# Patient Record
Sex: Male | Born: 1950
Health system: Southern US, Community
[De-identification: ages and names within clinical notes are randomized; demographics above are authoritative.]

## PROBLEM LIST (undated history)

## (undated) DIAGNOSIS — M109 Gout, unspecified: Secondary | ICD-10-CM

## (undated) DIAGNOSIS — E079 Disorder of thyroid, unspecified: Secondary | ICD-10-CM

## (undated) DIAGNOSIS — I10 Essential (primary) hypertension: Secondary | ICD-10-CM

## (undated) HISTORY — PX: NO PAST SURGERIES: SHX2092

---

## 2011-05-08 ENCOUNTER — Ambulatory Visit: Payer: Self-pay | Admitting: Gastroenterology

## 2012-03-18 ENCOUNTER — Ambulatory Visit: Payer: Self-pay | Admitting: Gastroenterology

## 2015-03-06 DIAGNOSIS — K219 Gastro-esophageal reflux disease without esophagitis: Secondary | ICD-10-CM | POA: Insufficient documentation

## 2015-03-06 DIAGNOSIS — M109 Gout, unspecified: Secondary | ICD-10-CM | POA: Insufficient documentation

## 2015-03-06 DIAGNOSIS — B182 Chronic viral hepatitis C: Secondary | ICD-10-CM | POA: Insufficient documentation

## 2015-03-06 DIAGNOSIS — I10 Essential (primary) hypertension: Secondary | ICD-10-CM | POA: Insufficient documentation

## 2015-10-02 DIAGNOSIS — E782 Mixed hyperlipidemia: Secondary | ICD-10-CM | POA: Insufficient documentation

## 2015-10-02 DIAGNOSIS — E785 Hyperlipidemia, unspecified: Secondary | ICD-10-CM | POA: Insufficient documentation

## 2015-10-02 DIAGNOSIS — E079 Disorder of thyroid, unspecified: Secondary | ICD-10-CM | POA: Insufficient documentation

## 2016-12-03 ENCOUNTER — Emergency Department (HOSPITAL_BASED_OUTPATIENT_CLINIC_OR_DEPARTMENT_OTHER)
Admission: EM | Admit: 2016-12-03 | Discharge: 2016-12-03 | Disposition: A | Discharge status: Home / Self Care | Payer: Medicare Other | Attending: Emergency Medicine | Admitting: Emergency Medicine

## 2016-12-03 ENCOUNTER — Emergency Department (HOSPITAL_BASED_OUTPATIENT_CLINIC_OR_DEPARTMENT_OTHER): Payer: Medicare Other

## 2016-12-03 ENCOUNTER — Encounter (HOSPITAL_BASED_OUTPATIENT_CLINIC_OR_DEPARTMENT_OTHER): Payer: Self-pay | Admitting: *Deleted

## 2016-12-03 DIAGNOSIS — Y929 Unspecified place or not applicable: Secondary | ICD-10-CM | POA: Insufficient documentation

## 2016-12-03 DIAGNOSIS — Y999 Unspecified external cause status: Secondary | ICD-10-CM | POA: Diagnosis not present

## 2016-12-03 DIAGNOSIS — S42035A Nondisplaced fracture of lateral end of left clavicle, initial encounter for closed fracture: Secondary | ICD-10-CM | POA: Diagnosis not present

## 2016-12-03 DIAGNOSIS — Y9389 Activity, other specified: Secondary | ICD-10-CM | POA: Diagnosis not present

## 2016-12-03 DIAGNOSIS — Z87891 Personal history of nicotine dependence: Secondary | ICD-10-CM | POA: Insufficient documentation

## 2016-12-03 DIAGNOSIS — I10 Essential (primary) hypertension: Secondary | ICD-10-CM | POA: Diagnosis not present

## 2016-12-03 DIAGNOSIS — Z7982 Long term (current) use of aspirin: Secondary | ICD-10-CM | POA: Insufficient documentation

## 2016-12-03 DIAGNOSIS — S4992XA Unspecified injury of left shoulder and upper arm, initial encounter: Secondary | ICD-10-CM | POA: Diagnosis present

## 2016-12-03 DIAGNOSIS — W1839XA Other fall on same level, initial encounter: Secondary | ICD-10-CM | POA: Insufficient documentation

## 2016-12-03 HISTORY — DX: Disorder of thyroid, unspecified: E07.9

## 2016-12-03 HISTORY — DX: Essential (primary) hypertension: I10

## 2016-12-03 HISTORY — DX: Gout, unspecified: M10.9

## 2016-12-03 MED ORDER — HYDROCODONE-ACETAMINOPHEN 5-325 MG PO TABS: 2.0000 | ORAL_TABLET | ORAL | 0 refills | Status: DC | PRN

## 2016-12-03 MED ORDER — HYDROCODONE-ACETAMINOPHEN 5-325 MG PO TABS
2.0000 | ORAL_TABLET | Freq: Once | ORAL | Status: AC
  Administered 2016-12-03: 2 via ORAL
  Filled 2016-12-03: qty 2

## 2016-12-03 MED FILL — HYDROCODON-APAP 5-325: 5-325 | 2 days supply | Qty: 20 | Fill #0

## 2016-12-03 NOTE — ED Provider Notes (Signed)
[facility] Provider Note   CSN: [id number] Arrival date & time: 12/03/16  1021     History   Chief Complaint Chief Complaint  Patient presents with  . Shoulder Pain    HPI Paul Burke is a 66 y.o. male.  The history is provided by the patient. No language interpreter was used.  Shoulder Pain   This is a new problem. The current episode started 2 days ago. The problem occurs constantly. The problem has been gradually worsening. The quality of the pain is described as aching. The pain is moderate. Associated symptoms include limited range of motion. He has tried nothing for the symptoms. The treatment provided no relief. There has been a history of trauma.  Pt felll on 1/1 Pt complains of pain in his shoulder after falling  Past Medical History:  Diagnosis Date  . Gout   . Hypertension   . Thyroid disease     There are no active problems to display for this patient.   History reviewed. No pertinent surgical history.     Home Medications    Prior to Admission medications   Medication Sig Start Date End Date Taking? Authorizing Provider  aspirin 81 MG chewable tablet Chew by mouth daily.   Yes Historical Provider, MD  HYDROcodone-acetaminophen (NORCO/VICODIN) 5-325 MG tablet Take 2 tablets by mouth every 4 (four) hours as needed. 12/03/16   Elson AreasLeslie K Sofia, PA-C    Family History History reviewed. No pertinent family history.  Social History Social History  Substance Use Topics  . Smoking status: Former Games developermoker  . Smokeless tobacco: Never Used  . Alcohol use Not on file     Allergies   Patient has no known allergies.   Review of Systems Review of Systems  All other systems reviewed and are negative.    Physical Exam Updated Vital Signs BP 118/86 (BP Location: Right Arm)   Pulse 118   Temp 98.4 F (36.9 C) (Oral)   Resp 18   Ht 6' (1.829 m)   Wt 105.2 kg   SpO2 99%   BMI 31.46 kg/m   Physical Exam  Constitutional: He is oriented  to person, place, and time. He appears well-developed and well-nourished.  HENT:  Head: Normocephalic.  Eyes: EOM are normal.  Neck: Normal range of motion.  Pulmonary/Chest: Effort normal.  Abdominal: He exhibits no distension.  Musculoskeletal: He exhibits tenderness.  Swollen distal clavicle, pain with movement.  nv and ns intact  Neurological: He is alert and oriented to person, place, and time.  Psychiatric: He has a normal mood and affect.  Nursing note and vitals reviewed.    ED Treatments / Results  Labs (all labs ordered are listed, but only abnormal results are displayed) Labs Reviewed - No data to display  EKG  EKG Interpretation None       Radiology Dg Shoulder Left  Result Date: 12/03/2016 CLINICAL DATA:  Larey SeatFell on left shoulder 3 days ago. Unable to abduct arm. Initial encounter. EXAM: LEFT SHOULDER - 2+ VIEW COMPARISON:  None. FINDINGS: A nondisplaced fracture is present within the distal left clavicle. The shoulder is located. The scapula is intact. The visualized hemithorax is unremarkable. IMPRESSION: 1. Nondisplaced fracture of the distal left clavicle. 2. The shoulder is otherwise intact. Electronically Signed   By: Marin Robertshristopher  Mattern M.D.   On: 12/03/2016 12:36    Procedures Procedures (including critical care time)  Medications Ordered in ED Medications  HYDROcodone-acetaminophen (NORCO/VICODIN) 5-325 MG per tablet 2 tablet (  2 tablets Oral Given 12/03/16 1246)     Initial Impression / Assessment and Plan / ED Course  I have reviewed the triage vital signs and the nursing notes.  Pertinent labs & imaging results that were available during my care of the patient were reviewed by me and considered in my medical decision making (see chart for details).  Clinical Course       Final Clinical Impressions(s) / ED Diagnoses   Final diagnoses:  Closed nondisplaced fracture of acromial end of left clavicle, initial encounter    New Prescriptions New  Prescriptions   HYDROCODONE-ACETAMINOPHEN (NORCO/VICODIN) 5-325 MG TABLET    Take 2 tablets by mouth every 4 (four) hours as needed.  An After Visit Summary was printed and given to the patient.   Lonia Skinner [place], PA-C 12/03/16 1345    Tilden Fossa, MD 12/04/16 [phone]

## 2016-12-03 NOTE — ED Triage Notes (Signed)
Pt reports horse playing with his grandson on NYE and landed on his left shoulder, decreased rom and pain continues.

## 2016-12-09 ENCOUNTER — Ambulatory Visit (INDEPENDENT_AMBULATORY_CARE_PROVIDER_SITE_OTHER): Payer: Medicare Other | Admitting: Family Medicine

## 2016-12-09 DIAGNOSIS — S42032A Displaced fracture of lateral end of left clavicle, initial encounter for closed fracture: Secondary | ICD-10-CM | POA: Diagnosis not present

## 2016-12-09 MED ORDER — HYDROCODONE-ACETAMINOPHEN 5-325 MG PO TABS: 1.0000 | ORAL_TABLET | ORAL | 0 refills | Status: DC | PRN

## 2016-12-09 MED ORDER — HYDROCODONE-ACETAMINOPHEN 5-325 MG PO TABS: 2.0000 | ORAL_TABLET | ORAL | 0 refills | Status: DC | PRN

## 2016-12-09 MED FILL — HYDROCODON-APAP 5-325: 5-325 | 5 days supply | Qty: 30 | Fill #0

## 2016-12-09 NOTE — Patient Instructions (Signed)
You have a distal clavicle fracture. I expect this to heal completely over 6-8 total weeks from when you fractured this. Icing 15 minutes at a time 3-4 times a day. Ibuprofen or aleve if needed for pain and inflammation. Norco as needed for severe pain (no driving on this medicine). Use sling as often as possible (ok to take off to wash this arm, drive but I wouldn't use the left hand on the steering wheel). Follow up with me in 1 week for reevaluation. Make sure you come out of the sling at least twice a day to do elbow motion exercises.

## 2016-12-10 ENCOUNTER — Encounter: Payer: Self-pay | Admitting: Family Medicine

## 2016-12-10 DIAGNOSIS — S42032D Displaced fracture of lateral end of left clavicle, subsequent encounter for fracture with routine healing: Secondary | ICD-10-CM | POA: Insufficient documentation

## 2016-12-10 NOTE — Progress Notes (Signed)
PCP: No PCP Per Patient  Subjective:   HPI: Patient is a 66 y.o. male here for left shoulder injury.  Patient reports he was playing with his grandson on 1/1 when he tripped over his foot and fell directly onto left shoulder. Immediate pain, some swelling lateral collarbone area. Pain 7/10, sharp, worse with washing hair. Using a sling regularly also. No other skin changes, numbness. No radiation of pain. No prior injuries. Right handed.  Past Medical History:  Diagnosis Date  . Gout   . Hypertension   . Thyroid disease     Current Outpatient Prescriptions on File Prior to Visit  Medication Sig Dispense Refill  . aspirin 81 MG chewable tablet Chew by mouth daily.     No current facility-administered medications on file prior to visit.     No past surgical history on file.  No Known Allergies  Social History   Social History  . Marital status: Single    Spouse name: N/A  . Number of children: N/A  . Years of education: N/A   Occupational History  . Not on file.   Social History Main Topics  . Smoking status: Former Games developermoker  . Smokeless tobacco: Never Used  . Alcohol use Not on file  . Drug use: Unknown  . Sexual activity: Not on file   Other Topics Concern  . Not on file   Social History Narrative  . No narrative on file    No family history on file.  BP 110/72   Pulse (!) 105   Ht 6' (1.829 m)   Wt 232 lb (105.2 kg)   BMI 31.46 kg/m   Review of Systems: See HPI above.     Objective:  Physical Exam:  Gen: NAD, comfortable in exam room  Left shoulder: Mild swelling over distal clavicle.  No other deformity. TTP distal clavicle.  No other tenderness. FROM digits, wrist, some difficulty with full elbow extension.  Did not test shoulder ROM with known fracture. Strength 5/5 with all these resisted motions of digits, wrist, elbow. Sensation intact to light touch. NV intact distally.  Right shoulder: FROM without pain.   Assessment &  Plan:  1. Left distal clavicle fracture - independently reviewed radiographs - fracture is nondisplaced.  Should heal well with conservative treatment over 6-8 weeks.  Sling for comfort - come out of this twice a day to do motion exercises of elbow.  Icing, ibuprofen/aleve with  norco as needed.  F/u in 1 week - repeat radiographs at that visit.

## 2016-12-10 NOTE — Assessment & Plan Note (Signed)
independently reviewed radiographs - fracture is nondisplaced.  Should heal well with conservative treatment over 6-8 weeks.  Sling for comfort - come out of this twice a day to do motion exercises of elbow.  Icing, ibuprofen/aleve with  norco as needed.  F/u in 1 week - repeat radiographs at that visit.

## 2016-12-17 ENCOUNTER — Ambulatory Visit: Payer: Medicare Other | Admitting: Family Medicine

## 2016-12-19 ENCOUNTER — Ambulatory Visit (HOSPITAL_BASED_OUTPATIENT_CLINIC_OR_DEPARTMENT_OTHER)
Admission: RE | Admit: 2016-12-19 | Discharge: 2016-12-19 | Disposition: A | Discharge status: Home / Self Care | Payer: Medicare Other | Source: Ambulatory Visit | Attending: Family Medicine | Admitting: Family Medicine

## 2016-12-19 ENCOUNTER — Encounter: Payer: Self-pay | Admitting: Family Medicine

## 2016-12-19 ENCOUNTER — Ambulatory Visit (INDEPENDENT_AMBULATORY_CARE_PROVIDER_SITE_OTHER): Payer: Medicare Other | Admitting: Family Medicine

## 2016-12-19 VITALS — BP 99/70 | Ht 72.0 in | Wt 232.0 lb

## 2016-12-19 DIAGNOSIS — S42032D Displaced fracture of lateral end of left clavicle, subsequent encounter for fracture with routine healing: Secondary | ICD-10-CM

## 2016-12-19 DIAGNOSIS — S42035D Nondisplaced fracture of lateral end of left clavicle, subsequent encounter for fracture with routine healing: Secondary | ICD-10-CM

## 2016-12-19 DIAGNOSIS — M19012 Primary osteoarthritis, left shoulder: Secondary | ICD-10-CM | POA: Diagnosis not present

## 2016-12-19 DIAGNOSIS — X58XXXD Exposure to other specified factors, subsequent encounter: Secondary | ICD-10-CM | POA: Diagnosis not present

## 2016-12-19 NOTE — Patient Instructions (Signed)
You have a distal clavicle fracture. I expect this to heal completely over 6-8 total weeks from when you fractured this. Icing 15 minutes at a time 3-4 times a day as needed (if this bothers you after icing you can just use it over the swollen area by your neck). Ibuprofen or aleve if needed for pain and inflammation. Arm circles, swings, table slides - 3 sets of 10 once or twice a day. Elbow motion exercises as well. Follow up with me in 2 weeks for reevaluation.

## 2016-12-22 NOTE — Progress Notes (Signed)
PCP: No PCP Per Patient  Subjective:   HPI: Patient is a 66 y.o. male here for left shoulder injury.  1/9: Patient reports he was playing with his grandson on 1/1 when he tripped over his foot and fell directly onto left shoulder. Immediate pain, some swelling lateral collarbone area. Pain 7/10, sharp, worse with washing hair. Using a sling regularly also. No other skin changes, numbness. No radiation of pain. No prior injuries. Right handed.  1/19: Patient reports he is doing well. Pain level has improved, less sharp. Still difficulty trying to wash hair. Using sling but also doing motion exercises of elbow. No skin changes, numbness.  Past Medical History:  Diagnosis Date  . Gout   . Hypertension   . Thyroid disease     Current Outpatient Prescriptions on File Prior to Visit  Medication Sig Dispense Refill  . amLODipine (NORVASC) 10 MG tablet     . aspirin 81 MG chewable tablet Chew by mouth daily.    . colchicine 0.6 MG tablet     . HYDROcodone-acetaminophen (NORCO/VICODIN) 5-325 MG tablet Take 1 tablet by mouth every 4 (four) hours as needed. 30 tablet 0  . levothyroxine (SYNTHROID, LEVOTHROID) 50 MCG tablet     . lisinopril (PRINIVIL,ZESTRIL) 40 MG tablet     . omeprazole (PRILOSEC) 40 MG capsule      No current facility-administered medications on file prior to visit.     No past surgical history on file.  No Known Allergies  Social History   Social History  . Marital status: Single    Spouse name: N/A  . Number of children: N/A  . Years of education: N/A   Occupational History  . Not on file.   Social History Main Topics  . Smoking status: Former Games developermoker  . Smokeless tobacco: Never Used  . Alcohol use Not on file  . Drug use: Unknown  . Sexual activity: Not on file   Other Topics Concern  . Not on file   Social History Narrative  . No narrative on file    No family history on file.  BP 99/70   Ht 6' (1.829 m)   Wt 232 lb (105.2 kg)    BMI 31.46 kg/m   Review of Systems: See HPI above.     Objective:  Physical Exam:  Gen: NAD, comfortable in exam room  Left shoulder: Mild swelling over distal clavicle.  No other deformity. TTP distal clavicle.  No other tenderness. FROM digits, wrist, still some difficulty with full elbow extension.  Did not test shoulder ROM with known fracture. Strength 5/5 with resisted motions of digits, wrist, elbow. Sensation intact to light touch. NV intact distally.  Right shoulder: FROM without pain.   Assessment & Plan:  1. Left distal clavicle fracture - independently reviewed repeat radiographs - fracture is nondisplaced without significant healing yet.   Should heal well with conservative treatment over total of 6-8 weeks.  Come out of sling to do motion exercises of shoulder now too which were shown today.  Continue elbow exercises.  Ibuprofen or aleve if needed, icing.  F/u in 2 weeks.  If doing well and no new injury may not need to repeat radiographs.

## 2016-12-22 NOTE — Assessment & Plan Note (Signed)
independently reviewed repeat radiographs - fracture is nondisplaced without significant healing yet.   Should heal well with conservative treatment over total of 6-8 weeks.  Come out of sling to do motion exercises of shoulder now too which were shown today.  Continue elbow exercises.  Ibuprofen or aleve if needed, icing.  F/u in 2 weeks.  If doing well and no new injury may not need to repeat radiographs.

## 2017-01-05 ENCOUNTER — Ambulatory Visit: Payer: Medicare Other | Admitting: Family Medicine

## 2017-01-06 ENCOUNTER — Ambulatory Visit: Payer: Medicare Other | Admitting: Family Medicine

## 2017-01-06 ENCOUNTER — Ambulatory Visit (INDEPENDENT_AMBULATORY_CARE_PROVIDER_SITE_OTHER): Payer: Medicare Other | Admitting: Family Medicine

## 2017-01-06 ENCOUNTER — Encounter: Payer: Self-pay | Admitting: Family Medicine

## 2017-01-06 DIAGNOSIS — S42032D Displaced fracture of lateral end of left clavicle, subsequent encounter for fracture with routine healing: Secondary | ICD-10-CM | POA: Diagnosis not present

## 2017-01-06 NOTE — Patient Instructions (Signed)
You have a distal clavicle fracture. Continue the motion exercises. Wait until 2/12 to start the theraband strengthening exercises. Call me if you want to do physical therapy. Icing if needed. Follow up with me on or around 2/26 for reevaluation.

## 2017-01-07 NOTE — Assessment & Plan Note (Signed)
Left distal clavicle fracture - Clinically improving.  Last radiographs with nondisplaced fracture.  Continue motion exercises and add strengthening exercises on 2/12.  F/u around 2/26 when he is 8 weeks out.  Consider radiographs if he doesn't continue to clinically improve.  Discontinue sling.  Ibuprofen or aleve if needed, icing.

## 2017-01-07 NOTE — Progress Notes (Signed)
PCP: No PCP Per Patient  Subjective:   HPI: Patient is a 66 y.o. male here for left shoulder injury.  1/9: Patient reports he was playing with his grandson on 1/1 when he tripped over his foot and fell directly onto left shoulder. Immediate pain, some swelling lateral collarbone area. Pain 7/10, sharp, worse with washing hair. Using a sling regularly also. No other skin changes, numbness. No radiation of pain. No prior injuries. Right handed.  1/19: Patient reports he is doing well. Pain level has improved, less sharp. Still difficulty trying to wash hair. Using sling but also doing motion exercises of elbow. No skin changes, numbness.  2/6: Patient reports he feels about 80% improved. Pain up to 3/10 at worst. Still with soreness mainly feels when reaching across body, overhead. Doing home exercises. No skin changes, numbness.  Past Medical History:  Diagnosis Date  . Gout   . Hypertension   . Thyroid disease     Current Outpatient Prescriptions on File Prior to Visit  Medication Sig Dispense Refill  . amLODipine (NORVASC) 10 MG tablet     . aspirin 81 MG chewable tablet Chew by mouth daily.    . colchicine 0.6 MG tablet     . HYDROcodone-acetaminophen (NORCO/VICODIN) 5-325 MG tablet Take 1 tablet by mouth every 4 (four) hours as needed. 30 tablet 0  . levothyroxine (SYNTHROID, LEVOTHROID) 50 MCG tablet     . lisinopril (PRINIVIL,ZESTRIL) 40 MG tablet     . omeprazole (PRILOSEC) 40 MG capsule      No current facility-administered medications on file prior to visit.     No past surgical history on file.  No Known Allergies  Social History   Social History  . Marital status: Single    Spouse name: N/A  . Number of children: N/A  . Years of education: N/A   Occupational History  . Not on file.   Social History Main Topics  . Smoking status: Former Games developermoker  . Smokeless tobacco: Never Used  . Alcohol use Not on file  . Drug use: Unknown  . Sexual  activity: Not on file   Other Topics Concern  . Not on file   Social History Narrative  . No narrative on file    No family history on file.  BP 138/86   Pulse 98   Ht 6' (1.829 m)   Wt 232 lb (105.2 kg)   BMI 31.46 kg/m   Review of Systems: See HPI above.     Objective:  Physical Exam:  Gen: NAD, comfortable in exam room  Left shoulder: Mild swelling over distal clavicle.  No other deformity. No TTP distal clavicle.  No other tenderness. FROM digits, wrist, elbow.  Lacks 20 degrees flexion, 40 degrees abduction shoulder. Strength 5/5 with resisted motions of digits, wrist, elbow. Sensation intact to light touch. NV intact distally.  Right shoulder: FROM without pain.   Assessment & Plan:  1. Left distal clavicle fracture - Clinically improving.  Last radiographs with nondisplaced fracture.  Continue motion exercises and add strengthening exercises on 2/12.  F/u around 2/26 when he is 8 weeks out.  Consider radiographs if he doesn't continue to clinically improve.  Discontinue sling.  Ibuprofen or aleve if needed, icing.

## 2017-01-26 ENCOUNTER — Ambulatory Visit: Payer: Medicare Other | Admitting: Family Medicine

## 2017-11-13 ENCOUNTER — Ambulatory Visit: Payer: Medicare Other | Admitting: Family Medicine

## 2017-11-20 ENCOUNTER — Ambulatory Visit (INDEPENDENT_AMBULATORY_CARE_PROVIDER_SITE_OTHER): Payer: Medicare Other | Admitting: Family Medicine

## 2017-11-20 ENCOUNTER — Encounter: Payer: Self-pay | Admitting: Family Medicine

## 2017-11-20 VITALS — BP 126/90 | HR 114 | Temp 98.5°F | Ht 72.0 in | Wt 232.1 lb

## 2017-11-20 DIAGNOSIS — Z23 Encounter for immunization: Secondary | ICD-10-CM | POA: Diagnosis not present

## 2017-11-20 DIAGNOSIS — E782 Mixed hyperlipidemia: Secondary | ICD-10-CM

## 2017-11-20 DIAGNOSIS — Z8619 Personal history of other infectious and parasitic diseases: Secondary | ICD-10-CM | POA: Diagnosis not present

## 2017-11-20 DIAGNOSIS — I10 Essential (primary) hypertension: Secondary | ICD-10-CM

## 2017-11-20 DIAGNOSIS — M109 Gout, unspecified: Secondary | ICD-10-CM | POA: Diagnosis not present

## 2017-11-20 DIAGNOSIS — Z125 Encounter for screening for malignant neoplasm of prostate: Secondary | ICD-10-CM

## 2017-11-20 DIAGNOSIS — R000 Tachycardia, unspecified: Secondary | ICD-10-CM | POA: Diagnosis not present

## 2017-11-20 DIAGNOSIS — Z0000 Encounter for general adult medical examination without abnormal findings: Secondary | ICD-10-CM | POA: Insufficient documentation

## 2017-11-20 DIAGNOSIS — E079 Disorder of thyroid, unspecified: Secondary | ICD-10-CM

## 2017-11-20 DIAGNOSIS — K219 Gastro-esophageal reflux disease without esophagitis: Secondary | ICD-10-CM | POA: Diagnosis not present

## 2017-11-20 LAB — URINALYSIS, ROUTINE W REFLEX MICROSCOPIC
Hgb urine dipstick: NEGATIVE
KETONES UR: NEGATIVE
LEUKOCYTES UA: NEGATIVE
Nitrite: NEGATIVE
PH: 6 (ref 5.0–8.0)
RBC / HPF: NONE SEEN (ref 0–?)
SPECIFIC GRAVITY, URINE: 1.02 (ref 1.000–1.030)
TOTAL PROTEIN, URINE-UPE24: NEGATIVE
URINE GLUCOSE: NEGATIVE
UROBILINOGEN UA: 1 (ref 0.0–1.0)

## 2017-11-20 LAB — LIPID PANEL
CHOLESTEROL: 147 mg/dL (ref 0–200)
HDL: 50.9 mg/dL (ref >39.00)
LDL CALC: 80 mg/dL (ref 0–99)
NonHDL: 95.69
TRIGLYCERIDES: 76 mg/dL (ref 0.0–149.0)
Total CHOL/HDL Ratio: 3
VLDL: 15.2 mg/dL (ref 0.0–40.0)

## 2017-11-20 LAB — COMPREHENSIVE METABOLIC PANEL
ALBUMIN: 4.4 g/dL (ref 3.5–5.2)
ALK PHOS: 61 U/L (ref 39–117)
ALT: 28 U/L (ref 0–53)
AST: 34 U/L (ref 0–37)
BILIRUBIN TOTAL: 0.6 mg/dL (ref 0.2–1.2)
BUN: 28 mg/dL — ABNORMAL HIGH (ref 6–23)
CALCIUM: 9.4 mg/dL (ref 8.4–10.5)
CHLORIDE: 107 meq/L (ref 96–112)
CO2: 23 mEq/L (ref 19–32)
CREATININE: 1.42 mg/dL (ref 0.40–1.50)
GFR: 63.81 mL/min (ref >60.00)
Glucose, Bld: 133 mg/dL — ABNORMAL HIGH (ref 70–99)
Potassium: 4.2 mEq/L (ref 3.5–5.1)
Sodium: 140 mEq/L (ref 135–145)
TOTAL PROTEIN: 8.6 g/dL — AB (ref 6.0–8.3)

## 2017-11-20 LAB — TSH: TSH: 1.4 u[IU]/mL (ref 0.35–4.50)

## 2017-11-20 LAB — CBC
HEMATOCRIT: 39.4 % (ref 39.0–52.0)
Hemoglobin: 12.9 g/dL — ABNORMAL LOW (ref 13.0–17.0)
MCHC: 32.7 g/dL (ref 30.0–36.0)
MCV: 96.4 fl (ref 78.0–100.0)
PLATELETS: 215 10*3/uL (ref 150.0–400.0)
RBC: 4.09 Mil/uL — AB (ref 4.22–5.81)
RDW: 13.4 % (ref 11.5–15.5)
WBC: 6.1 10*3/uL (ref 4.0–10.5)

## 2017-11-20 LAB — URIC ACID: URIC ACID, SERUM: 7.3 mg/dL (ref 4.0–7.8)

## 2017-11-20 LAB — PSA: PSA: 0.79 ng/mL (ref 0.10–4.00)

## 2017-11-20 MED ORDER — LISINOPRIL 40 MG PO TABS: 40.0000 mg | ORAL_TABLET | Freq: Every day | ORAL | 0 refills | Status: DC

## 2017-11-20 MED ORDER — OMEPRAZOLE 40 MG PO CPDR: 40.0000 mg | DELAYED_RELEASE_CAPSULE | Freq: Every day | ORAL | 0 refills | Status: DC

## 2017-11-20 MED ORDER — ALLOPURINOL 100 MG PO TABS: 200.0000 mg | ORAL_TABLET | Freq: Every day | ORAL | 0 refills | Status: DC

## 2017-11-20 MED ORDER — AMLODIPINE BESYLATE 10 MG PO TABS: 10.0000 mg | ORAL_TABLET | Freq: Every day | ORAL | 0 refills | Status: DC

## 2017-11-20 NOTE — Progress Notes (Signed)
Subjective:  Patient ID: Paul Burke, male    DOB: 02/27/1951  Age: 66 y.o. MRN: [id number]  CC: Establish Care   HPI Paul Burke presents for establishment of care and follow-up of his listed problems.  He has never been diagnosed with an elevated pulse rate 4.  He does not smoke, use illicit drugs, or drink beverages with caffeine.  Occasional beer on the weekends watching the game but has not had any alcohol in over a week now.  His thyroid medicine was increased several months ago.  History of hep C that was treated and cured.  History of gout.  He has been taking allopurinol 200 mg with colchicine twice daily for over 2 months.  He has not had a gouty attack since that time.  Last colonoscopy was 4-5 years ago.  His brother Paul Burke is also my patient.  History Paul Burke has a past medical history of Gout, Hypertension, and Thyroid disease.   He has no past surgical history on file.   His family history is not on file.He reports that he has quit smoking. he has never used smokeless tobacco. His alcohol and drug histories are not on file.  Outpatient Medications Prior to Visit  Medication Sig Dispense Refill  . aspirin 81 MG chewable tablet Chew 81 mg by mouth daily.     . colchicine 0.6 MG tablet Take 0.6 mg by mouth daily.     [name]. levothyroxine (SYNTHROID, LEVOTHROID) 75 MCG tablet TAKE 1 TABLET BY MOUTH EVERY DAY IN THE MORNING    . allopurinol (ZYLOPRIM) 100 MG tablet TAKE 2 TABLETS BY MOUTH EVERY DAY    . amLODipine (NORVASC) 10 MG tablet     . amLODipine (NORVASC) 10 MG tablet Take 1 tablet by mouth daily.    [name]. lisinopril (PRINIVIL,ZESTRIL) 40 MG tablet Take 40 mg by mouth daily.     [name]. omeprazole (PRILOSEC) 40 MG capsule Take 40 mg by mouth daily.     [name]. HYDROcodone-acetaminophen (NORCO/VICODIN) 5-325 MG tablet Take 1 tablet by mouth every 4 (four) hours as needed. 30 tablet 0  . levothyroxine (SYNTHROID, LEVOTHROID) 50 MCG tablet      No facility-administered medications prior  to visit.     ROS Review of Systems  Constitutional: Negative.  Negative for chills, fever and unexpected weight change.  HENT: Negative.   Eyes: Negative for photophobia and visual disturbance.  Respiratory: Negative.   Cardiovascular: Negative for chest pain, palpitations and leg swelling.  Gastrointestinal: Negative.  Negative for anal bleeding and blood in stool.  Endocrine: Negative for cold intolerance and heat intolerance.  Genitourinary: Negative for difficulty urinating, hematuria and urgency.  Musculoskeletal: Negative for gait problem and myalgias.  Skin: Negative for pallor and rash.  Allergic/Immunologic: Negative for immunocompromised state.  Neurological: Negative for weakness and headaches.  Hematological: Does not bruise/bleed easily.  Psychiatric/Behavioral: Negative for agitation and dysphoric mood.    Objective:  BP 126/90 (BP Location: Left Arm, Patient Position: Sitting, Cuff Size: Normal)   Pulse (!) 114   Temp 98.5 F (36.9 C) (Oral)   Ht 6' (1.829 m)   Wt 232 lb 2 oz (105.3 kg)   SpO2 98%   BMI 31.48 kg/m   Physical Exam  Constitutional: He is oriented to person, place, and time. He appears well-developed and well-nourished. No distress.  HENT:  Head: Normocephalic and atraumatic.  Right Ear: External ear normal.  Left Ear: External ear normal.  Mouth/Throat: Oropharynx is clear and moist.  No oropharyngeal exudate.  Eyes: Conjunctivae are normal. Pupils are equal, round, and reactive to light. Right eye exhibits no discharge. Left eye exhibits no discharge. No scleral icterus.  Neck: Neck supple. No JVD present. No tracheal deviation present. No thyromegaly present.  Cardiovascular: Regular rhythm and normal heart sounds. Tachycardia present.  Pulmonary/Chest: Effort normal and breath sounds normal. No stridor.  Abdominal: Soft. Bowel sounds are normal. He exhibits no distension. There is no tenderness. There is no rebound and no guarding.    Genitourinary: Rectal exam shows no external hemorrhoid, no internal hemorrhoid, no fissure, no mass, no tenderness, anal tone normal and guaiac negative stool. Prostate is not enlarged and not tender.  Lymphadenopathy:    He has no cervical adenopathy.  Neurological: He is alert and oriented to person, place, and time.  Skin: Skin is warm and dry. No rash noted. He is not diaphoretic. No erythema.  Psychiatric: He has a normal mood and affect. His behavior is normal.      Assessment & Plan:   Paul Burke was seen today for establish care.  Diagnoses and all orders for this visit:  Need for influenza vaccination -     Flu vaccine HIGH DOSE PF  Need for vaccination against Streptococcus pneumoniae using pneumococcal conjugate vaccine 13 -     Pneumococcal conjugate vaccine 13-valent  Mixed hyperlipidemia -     Comprehensive metabolic panel -     Lipid panel  Gout without tophus -     allopurinol (ZYLOPRIM) 100 MG tablet; Take 2 tablets (200 mg total) by mouth daily. TAKE 2 TABLETS BY MOUTH EVERY DAY -     Uric acid  Thyroid disease -     TSH  Gastroesophageal reflux disease without esophagitis -     omeprazole (PRILOSEC) 40 MG capsule; Take 1 capsule (40 mg total) by mouth daily.  Essential hypertension -     amLODipine (NORVASC) 10 MG tablet; Take 1 tablet (10 mg total) by mouth daily. -     lisinopril (PRINIVIL,ZESTRIL) 40 MG tablet; Take 1 tablet (40 mg total) by mouth daily. -     CBC -     Comprehensive metabolic panel -     Urinalysis, Routine w reflex microscopic  Elevated pulse rate -     TSH  Screening for prostate cancer -     PSA  History of hepatitis C   I have discontinued Paul Burke HYDROcodone-acetaminophen and amLODipine. I have also changed his allopurinol, amLODipine, lisinopril, and omeprazole. Additionally, I am having him maintain his aspirin, colchicine, and levothyroxine.  Meds ordered this encounter  Medications  . allopurinol  (ZYLOPRIM) 100 MG tablet    Sig: Take 2 tablets (200 mg total) by mouth daily. TAKE 2 TABLETS BY MOUTH EVERY DAY    Dispense:  180 tablet    Refill:  0  . amLODipine (NORVASC) 10 MG tablet    Sig: Take 1 tablet (10 mg total) by mouth daily.    Dispense:  90 tablet    Refill:  0  . lisinopril (PRINIVIL,ZESTRIL) 40 MG tablet    Sig: Take 1 tablet (40 mg total) by mouth daily.    Dispense:  90 tablet    Refill:  0  . omeprazole (PRILOSEC) 40 MG capsule    Sig: Take 1 capsule (40 mg total) by mouth daily.    Dispense:  90 capsule    Refill:  0   I asked patient to go ahead and hold his  colchicine and continue the allopurinol at 200 mg.  Uric acid level pends.  He will follow-up in 2 weeks and we will recheck his pulse rate.  TSH level pends  Follow-up: Return in about 2 weeks (around 12/04/2017).  Mliss Sax, MD

## 2017-11-25 ENCOUNTER — Encounter: Payer: Self-pay | Admitting: Family Medicine

## 2017-11-25 NOTE — Telephone Encounter (Signed)
-----   Message from Luvenia ReddenSarah E Self, CMA sent at 11/25/2017  9:34 AM EST ----- Left voicemail for patient to call the office back.  CRM created. Please schedule 3 month follow up with Dr. Doreene BurkeKremer when patient returns call.

## 2017-11-25 NOTE — Telephone Encounter (Signed)
This encounter was created in error - please disregard.

## 2017-11-25 NOTE — Telephone Encounter (Signed)
Results given  Needed to make appt

## 2017-12-22 ENCOUNTER — Telehealth: Payer: Self-pay | Admitting: Family Medicine

## 2017-12-22 ENCOUNTER — Encounter: Payer: Self-pay | Admitting: Family Medicine

## 2017-12-22 ENCOUNTER — Ambulatory Visit (INDEPENDENT_AMBULATORY_CARE_PROVIDER_SITE_OTHER): Payer: Medicare Other | Admitting: Family Medicine

## 2017-12-22 ENCOUNTER — Ambulatory Visit: Payer: Medicare Other | Admitting: Family Medicine

## 2017-12-22 VITALS — BP 136/90 | HR 98 | Ht 72.0 in | Wt 236.0 lb

## 2017-12-22 DIAGNOSIS — H9202 Otalgia, left ear: Secondary | ICD-10-CM | POA: Diagnosis not present

## 2017-12-22 DIAGNOSIS — D649 Anemia, unspecified: Secondary | ICD-10-CM | POA: Diagnosis not present

## 2017-12-22 DIAGNOSIS — Z7289 Other problems related to lifestyle: Secondary | ICD-10-CM | POA: Insufficient documentation

## 2017-12-22 DIAGNOSIS — R000 Tachycardia, unspecified: Secondary | ICD-10-CM | POA: Diagnosis not present

## 2017-12-22 DIAGNOSIS — Z789 Other specified health status: Secondary | ICD-10-CM | POA: Diagnosis not present

## 2017-12-22 LAB — CBC
HEMATOCRIT: 38.9 % — AB (ref 39.0–52.0)
Hemoglobin: 12.9 g/dL — ABNORMAL LOW (ref 13.0–17.0)
MCHC: 33.3 g/dL (ref 30.0–36.0)
MCV: 95.4 fl (ref 78.0–100.0)
PLATELETS: 205 10*3/uL (ref 150.0–400.0)
RBC: 4.07 Mil/uL — AB (ref 4.22–5.81)
RDW: 14.2 % (ref 11.5–15.5)
WBC: 5.4 10*3/uL (ref 4.0–10.5)

## 2017-12-22 LAB — IRON,TIBC AND FERRITIN PANEL
%SAT: 39 % (calc) (ref 15–60)
Ferritin: 252 ng/mL (ref 20–380)
Iron: 135 ug/dL (ref 50–180)
TIBC: 346 mcg/dL (calc) (ref 250–425)

## 2017-12-22 MED ORDER — FLUTICASONE PROPIONATE 50 MCG/ACT NA SUSP: 2.0000 | Freq: Every day | NASAL | 6 refills | Status: DC

## 2017-12-22 MED ORDER — LEVOTHYROXINE SODIUM 75 MCG PO TABS: 75.0000 ug | ORAL_TABLET | Freq: Every day | ORAL | 1 refills | Status: DC

## 2017-12-22 NOTE — Telephone Encounter (Signed)
Pt requesting refill of Levothyroxine 5275mcg-90 day supply. Medication not previously prescribed by Dr. Doreene BurkeKremer. Last OV to establish care was on 11/20/17

## 2017-12-22 NOTE — Progress Notes (Signed)
Subjective:  Patient ID: Paul Burke, male    DOB: 12/17/1950  Age: 67 y.o. MRN: [id number]  CC: left ear pain   HPI Paul Burke presents for 2-day history of left ear pain.  There is been no decrease in hearing or URI signs and symptoms.  He says he took a piece of paper with some wax out of his ear.  He tells me that he had a colonoscopy he thinks that 2 years ago and cannot remember the results.  He is taking his thyroid medicine as directed daily.  He tells me that he drinks up to 4 16 ounce beers daily when he is watching sports games.  Denies blood in his stool or melena.  He tells me he is certain that he had a colonoscopy and an EGD done a few years ago.  Chart review shows that he was sent a certified letter at this time but we see no evidence of a procedure.  He is going to go to the place where it was done and bring back documentation of the procedure.  In the meantime I have already requested that he be seen for consultation for colonoscopy.  Outpatient Medications Prior to Visit  Medication Sig Dispense Refill  . allopurinol (ZYLOPRIM) 100 MG tablet Take 2 tablets (200 mg total) by mouth daily. TAKE 2 TABLETS BY MOUTH EVERY DAY 180 tablet 0  . amLODipine (NORVASC) 10 MG tablet Take 1 tablet (10 mg total) by mouth daily. 90 tablet 0  . aspirin 81 MG chewable tablet Chew 81 mg by mouth daily.     . colchicine 0.6 MG tablet Take 0.6 mg by mouth daily.     [name]. levothyroxine (SYNTHROID, LEVOTHROID) 75 MCG tablet TAKE 1 TABLET BY MOUTH EVERY DAY IN THE MORNING    . lisinopril (PRINIVIL,ZESTRIL) 40 MG tablet Take 1 tablet (40 mg total) by mouth daily. 90 tablet 0  . omeprazole (PRILOSEC) 40 MG capsule Take 1 capsule (40 mg total) by mouth daily. 90 capsule 0   No facility-administered medications prior to visit.     ROS Review of Systems  Constitutional: Negative for chills, diaphoresis and fatigue.  HENT: Positive for ear discharge and ear pain. Negative for congestion, facial  swelling, postnasal drip, rhinorrhea, sinus pressure, sinus pain and sore throat.   Eyes: Negative for photophobia and visual disturbance.  Respiratory: Negative for wheezing.   Cardiovascular: Negative for chest pain and palpitations.  Gastrointestinal: Negative for anal bleeding, blood in stool, constipation, diarrhea, nausea and vomiting.  Endocrine: Negative for cold intolerance, heat intolerance and polyphagia.  Musculoskeletal: Negative for joint swelling.  Skin: Negative for pallor and rash.  Allergic/Immunologic: Negative for immunocompromised state.  Neurological: Negative for headaches.  Hematological: Negative.   Psychiatric/Behavioral: Negative.     Objective:  BP 136/90 (BP Location: Left Arm, Patient Position: Sitting, Cuff Size: Normal)   Pulse 98   Ht 6' (1.829 m)   Wt 236 lb (107 kg)   SpO2 98%   BMI 32.01 kg/m   BP Readings from Last 3 Encounters:  12/22/17 136/90  11/20/17 126/90  01/06/17 138/86    Wt Readings from Last 3 Encounters:  12/22/17 236 lb (107 kg)  11/20/17 232 lb 2 oz (105.3 kg)  01/06/17 232 lb (105.2 kg)    Physical Exam  Constitutional: He is oriented to person, place, and time. He appears well-developed and well-nourished. No distress.  HENT:  Head: Normocephalic and atraumatic.  Right Ear: External ear  normal.  Left Ear: External ear normal.  Ears:  Mouth/Throat: Oropharynx is clear and moist. No oropharyngeal exudate.  Eyes: Conjunctivae are normal. Pupils are equal, round, and reactive to light. Right eye exhibits no discharge. Left eye exhibits no discharge. No scleral icterus.  Neck: Neck supple. No JVD present. No tracheal deviation present. No thyromegaly present.  Cardiovascular: Normal rate, regular rhythm and normal heart sounds. Exam reveals no gallop and no friction rub.  No murmur heard. Pulmonary/Chest: Effort normal and breath sounds normal. No stridor.  Abdominal: Bowel sounds are normal.  Lymphadenopathy:    He  has no cervical adenopathy.  Neurological: He is alert and oriented to person, place, and time.  Skin: Skin is warm and dry. He is not diaphoretic.  Psychiatric: He has a normal mood and affect. His behavior is normal.    Lab Results  Component Value Date   WBC 6.1 11/20/2017   HGB 12.9 (L) 11/20/2017   HCT 39.4 11/20/2017   PLT 215.0 11/20/2017   GLUCOSE 133 (H) 11/20/2017   CHOL 147 11/20/2017   TRIG 76.0 11/20/2017   HDL 50.90 11/20/2017   LDLCALC 80 11/20/2017   ALT 28 11/20/2017   AST 34 11/20/2017   NA 140 11/20/2017   K 4.2 11/20/2017   CL 107 11/20/2017   CREATININE 1.42 11/20/2017   BUN 28 (H) 11/20/2017   CO2 23 11/20/2017   TSH 1.40 11/20/2017   PSA 0.79 11/20/2017    Dg Clavicle Left  Result Date: 12/19/2016 CLINICAL DATA:  History of fall with a distal left clavicle fracture 11/30/2016. Subsequent encounter. EXAM: LEFT CLAVICLE - 2+ VIEWS [facility] COMPARISON:  Plain films left shoulder 12/03/2016. FINDINGS: Nondisplaced fracture of the distal clavicle is again seen. There is some early healing about the fracture. The acromioclavicular joint is intact with degenerative change noted. Glenohumeral joint is unremarkable. IMPRESSION: Nondisplaced distal left clavicle fracture with early healing. Acromioclavicular osteoarthritis. Electronically Signed   By: Drusilla Kanner M.D.   On: 12/19/2016 12:35    Assessment & Plan:   Shondell was seen today for left ear pain.  Diagnoses and all orders for this visit:  Ear pain, left -     Ambulatory referral to ENT -     fluticasone (FLONASE) 50 MCG/ACT nasal spray; Place 2 sprays into both nostrils daily.  Tachycardia -     EKG 12-Lead  Anemia, unspecified type -     CBC -     Iron, TIBC and Ferritin Panel -     Ambulatory referral to Gastroenterology  Drinks alcohol   I am having Paul Burke start on fluticasone. I am also having him maintain his aspirin, colchicine, levothyroxine, allopurinol, amLODipine,  lisinopril, and omeprazole.  Meds ordered this encounter  Medications  . fluticasone (FLONASE) 50 MCG/ACT nasal spray    Sig: Place 2 sprays into both nostrils daily.    Dispense:  16 g    Refill:  6   EKG did show normal sinus rhythm with a rate of 80.  We are rechecking a CBC with an iron panel today.  He is certain that he had a colonoscopy and EGD done.  [facility] but there is no documentation of it in the chart.  There is documentation that they send him a certified letter requesting that he come in for consultation for the procedure.  He says that this is where he had the procedure done.  Follow-up is in 2 months.  Follow-up: Return in  about 2 months (around 02/19/2018).  Mliss Sax, MD

## 2017-12-22 NOTE — Telephone Encounter (Signed)
Copied from CRM [id number]. Topic: General - Other >> Dec 22, 2017 11:33 AM Raquel SarnaHayes, Teresa G wrote: Levothyroxine 75 mg - 90 day supply  [facility] -  [phone]

## 2017-12-22 NOTE — Telephone Encounter (Signed)
Rx sent 

## 2018-01-01 NOTE — Progress Notes (Deleted)
Subjective:   Paul Burke is a 67 y.o. male who presents for an Initial Medicare Annual Wellness Visit. The Patient was informed that the wellness visit is to identify future health risk and educate and initiate measures that can reduce risk for increased disease through the lifespan.   Describes health as fair, good or great?  Review of Systems No ROS.  Medicare Wellness Visit. Additional risk factors are reflected in the social history.    Sleep patterns: Home Safety/Smoke Alarms: Feels safe in home. Smoke alarms in place.  Living environment; residence and Firearm Safety:  Seat Belt Safety/Bike Helmet: Wears seat belt.   Male:   CCS-     PSA-  Lab Results  Component Value Date   PSA 0.79 11/20/2017      Objective:    There were no vitals filed for this visit. There is no height or weight on file to calculate BMI.  Advanced Directives 12/03/2016  Does Patient Have a Medical Advance Directive? No  Would patient like information on creating a medical advance directive? No - Patient declined    Current Medications (verified) Outpatient Encounter Medications as of 01/06/2018  Medication Sig  . allopurinol (ZYLOPRIM) 100 MG tablet Take 2 tablets (200 mg total) by mouth daily. TAKE 2 TABLETS BY MOUTH EVERY DAY  . amLODipine (NORVASC) 10 MG tablet Take 1 tablet (10 mg total) by mouth daily.  [name]. aspirin 81 MG chewable tablet Chew 81 mg by mouth daily.   . colchicine 0.6 MG tablet Take 0.6 mg by mouth daily.   . fluticasone (FLONASE) 50 MCG/ACT nasal spray Place 2 sprays into both nostrils daily.  [name]. levothyroxine (SYNTHROID, LEVOTHROID) 75 MCG tablet Take 1 tablet (75 mcg total) by mouth daily before breakfast. TAKE 1 TABLET BY MOUTH EVERY DAY IN THE MORNING  . lisinopril (PRINIVIL,ZESTRIL) 40 MG tablet Take 1 tablet (40 mg total) by mouth daily.  [name]. omeprazole (PRILOSEC) 40 MG capsule Take 1 capsule (40 mg total) by mouth daily.   No facility-administered encounter  medications on file as of 01/06/2018.     Allergies (verified) Patient has no known allergies.   History: Past Medical History:  Diagnosis Date  . Gout   . Hypertension   . Thyroid disease    No past surgical history on file. No family history on file. Social History   Socioeconomic History  . Marital status: Single    Spouse name: Not on file  . Number of children: Not on file  . Years of education: Not on file  . Highest education level: Not on file  Social Needs  . Financial resource strain: Not on file  . Food insecurity - worry: Not on file  . Food insecurity - inability: Not on file  . Transportation needs - medical: Not on file  . Transportation needs - non-medical: Not on file  Occupational History  . Not on file  Tobacco Use  . Smoking status: Former Games developermoker  . Smokeless tobacco: Never Used  Substance and Sexual Activity  . Alcohol use: Not on file  . Drug use: Not on file  . Sexual activity: Not on file  Other Topics Concern  . Not on file  Social History Narrative  . Not on file   Tobacco Counseling Counseling given: Not Answered   Clinical Intake:                       Activities of Daily Living No flowsheet  data found.   Immunizations and Health Maintenance Immunization History  Administered Date(s) Administered  . Influenza, High Dose Seasonal PF 11/20/2017  . Influenza,inj,Quad PF,6+ Mos 10/02/2015, 11/05/2016  . Pneumococcal Conjugate-13 11/20/2017  . Pneumococcal Polysaccharide-23 10/02/2015   Health Maintenance Due  Topic Date Due  . TETANUS/TDAP  03/02/1969    Patient Care Team: Mliss Sax, MD as PCP - General (Family Medicine)  Indicate any recent Medical Services you may have received from other than [facility] providers in the past year (date may be approximate).    Assessment:   This is a routine wellness examination for Paul Burke. Physical assessment deferred to PCP.  Hearing/Vision screen No exam data  present  Dietary issues and exercise activities discussed:   Diet (meal preparation, eat out, water intake, caffeinated beverages, dairy products, fruits and vegetables): {Desc; diets:16563} Breakfast: Lunch:  Dinner:      Goals    None     Depression Screen PHQ 2/9 Scores 12/19/2016  PHQ - 2 Score 0  Exception Documentation Other- indicate reason in comment box    Fall Risk Fall Risk  12/19/2016  Falls in the past year? No  Risk for fall due to : Other (Comment)    Cognitive Function:        Screening Tests Health Maintenance  Topic Date Due  . TETANUS/TDAP  03/02/1969  . COLONOSCOPY  08/30/2024  . INFLUENZA VACCINE  Completed  . Hepatitis C Screening  Completed  . PNA vac Low Risk Adult  Completed      Plan:   ***  I have personally reviewed and noted the following in the patient's chart:   . Medical and social history . Use of alcohol, tobacco or illicit drugs  . Current medications and supplements . Functional ability and status . Nutritional status . Physical activity . Advanced directives . List of other physicians . Hospitalizations, surgeries, and ER visits in previous 12 months . Vitals . Screenings to include cognitive, depression, and falls . Referrals and appointments  In addition, I have reviewed and discussed with patient certain preventive protocols, quality metrics, and best practice recommendations. A written personalized care plan for preventive services as well as general preventive health recommendations were provided to patient.     [place]on Gully, [place]   01/01/2018

## 2018-01-05 DIAGNOSIS — H900 Conductive hearing loss, bilateral: Secondary | ICD-10-CM | POA: Insufficient documentation

## 2018-01-06 ENCOUNTER — Ambulatory Visit: Payer: Medicare Other | Admitting: Behavioral Health

## 2018-02-21 ENCOUNTER — Other Ambulatory Visit: Payer: Self-pay | Admitting: Family Medicine

## 2018-02-21 DIAGNOSIS — M109 Gout, unspecified: Secondary | ICD-10-CM

## 2018-02-23 ENCOUNTER — Ambulatory Visit: Payer: Medicare Other | Admitting: Family Medicine

## 2018-02-23 DIAGNOSIS — Z0289 Encounter for other administrative examinations: Secondary | ICD-10-CM

## 2018-02-24 ENCOUNTER — Encounter: Payer: Self-pay | Admitting: Family Medicine

## 2018-03-09 ENCOUNTER — Ambulatory Visit: Payer: Medicare Other | Admitting: Family Medicine

## 2018-03-11 ENCOUNTER — Encounter: Payer: Self-pay | Admitting: Family Medicine

## 2018-03-11 ENCOUNTER — Ambulatory Visit (INDEPENDENT_AMBULATORY_CARE_PROVIDER_SITE_OTHER): Payer: Medicare Other | Admitting: Family Medicine

## 2018-03-11 VITALS — BP 120/78 | HR 99 | Ht 72.0 in | Wt 230.1 lb

## 2018-03-11 DIAGNOSIS — D649 Anemia, unspecified: Secondary | ICD-10-CM

## 2018-03-11 DIAGNOSIS — M109 Gout, unspecified: Secondary | ICD-10-CM

## 2018-03-11 DIAGNOSIS — R7309 Other abnormal glucose: Secondary | ICD-10-CM | POA: Insufficient documentation

## 2018-03-11 DIAGNOSIS — I10 Essential (primary) hypertension: Secondary | ICD-10-CM

## 2018-03-11 LAB — CBC
HEMATOCRIT: 40.5 % (ref 39.0–52.0)
Hemoglobin: 13.4 g/dL (ref 13.0–17.0)
MCHC: 33 g/dL (ref 30.0–36.0)
MCV: 96 fl (ref 78.0–100.0)
Platelets: 212 10*3/uL (ref 150.0–400.0)
RBC: 4.22 Mil/uL (ref 4.22–5.81)
RDW: 15 % (ref 11.5–15.5)
WBC: 6.5 10*3/uL (ref 4.0–10.5)

## 2018-03-11 LAB — BASIC METABOLIC PANEL
BUN: 22 mg/dL (ref 6–23)
CALCIUM: 9.5 mg/dL (ref 8.4–10.5)
CO2: 23 mEq/L (ref 19–32)
Chloride: 105 mEq/L (ref 96–112)
Creatinine, Ser: 1.51 mg/dL — ABNORMAL HIGH (ref 0.40–1.50)
GFR: 59.39 mL/min — AB (ref >60.00)
GLUCOSE: 125 mg/dL — AB (ref 70–99)
Potassium: 4.3 mEq/L (ref 3.5–5.1)
SODIUM: 135 meq/L (ref 135–145)

## 2018-03-11 LAB — URIC ACID: URIC ACID, SERUM: 5.4 mg/dL (ref 4.0–7.8)

## 2018-03-11 LAB — B12 AND FOLATE PANEL
Folate: 15.4 ng/mL (ref >5.9)
Vitamin B-12: 292 pg/mL (ref 211–911)

## 2018-03-11 LAB — HEMOGLOBIN A1C: Hgb A1c MFr Bld: 6.2 % (ref 4.6–6.5)

## 2018-03-11 MED ORDER — LISINOPRIL 40 MG PO TABS: 40.0000 mg | ORAL_TABLET | Freq: Every day | ORAL | 0 refills | Status: DC

## 2018-03-11 MED ORDER — AMLODIPINE BESYLATE 10 MG PO TABS: 10.0000 mg | ORAL_TABLET | Freq: Every day | ORAL | 0 refills | Status: DC

## 2018-03-11 NOTE — Patient Instructions (Signed)
DASH Eating Plan DASH stands for "Dietary Approaches to Stop Hypertension." The DASH eating plan is a healthy eating plan that has been shown to reduce high blood pressure (hypertension). It may also reduce your risk for type 2 diabetes, heart disease, and stroke. The DASH eating plan may also help with weight loss. What are tips for following this plan? General guidelines  Avoid eating more than 2,300 mg (milligrams) of salt (sodium) a day. If you have hypertension, you may need to reduce your sodium intake to 1,500 mg a day.  Limit alcohol intake to no more than 1 drink a day for nonpregnant women and 2 drinks a day for men. One drink equals 12 oz of beer, 5 oz of wine, or 1 oz of hard liquor.  Work with your health care provider to maintain a healthy body weight or to lose weight. Ask what an ideal weight is for you.  Get at least 30 minutes of exercise that causes your heart to beat faster (aerobic exercise) most days of the week. Activities may include walking, swimming, or biking.  Work with your health care provider or diet and nutrition specialist (dietitian) to adjust your eating plan to your individual calorie needs. Reading food labels  Check food labels for the amount of sodium per serving. Choose foods with less than 5 percent of the Daily Value of sodium. Generally, foods with less than 300 mg of sodium per serving fit into this eating plan.  To find whole grains, look for the word "whole" as the first word in the ingredient list. Shopping  Buy products labeled as "low-sodium" or "no salt added."  Buy fresh foods. Avoid canned foods and premade or frozen meals. Cooking  Avoid adding salt when cooking. Use salt-free seasonings or herbs instead of table salt or sea salt. Check with your health care provider or pharmacist before using salt substitutes.  Do not fry foods. Cook foods using healthy methods such as baking, boiling, grilling, and broiling instead.  Cook with  heart-healthy oils, such as olive, canola, soybean, or sunflower oil. Meal planning   Eat a balanced diet that includes: ? 5 or more servings of fruits and vegetables each day. At each meal, try to fill half of your plate with fruits and vegetables. ? Up to 6-8 servings of whole grains each day. ? Less than 6 oz of lean meat, poultry, or fish each day. A 3-oz serving of meat is about the same size as a deck of cards. One egg equals 1 oz. ? 2 servings of low-fat dairy each day. ? A serving of nuts, seeds, or beans 5 times each week. ? Heart-healthy fats. Healthy fats called Omega-3 fatty acids are found in foods such as flaxseeds and coldwater fish, like sardines, salmon, and mackerel.  Limit how much you eat of the following: ? Canned or prepackaged foods. ? Food that is high in trans fat, such as fried foods. ? Food that is high in saturated fat, such as fatty meat. ? Sweets, desserts, sugary drinks, and other foods with added sugar. ? Full-fat dairy products.  Do not salt foods before eating.  Try to eat at least 2 vegetarian meals each week.  Eat more home-cooked food and less restaurant, buffet, and fast food.  When eating at a restaurant, ask that your food be prepared with less salt or no salt, if possible. What foods are recommended? The items listed may not be a complete list. Talk with your dietitian about what   dietary choices are best for you. Grains Whole-grain or whole-wheat bread. Whole-grain or whole-wheat pasta. Brown rice. Oatmeal. Quinoa. Bulgur. Whole-grain and low-sodium cereals. Pita bread. Low-fat, low-sodium crackers. Whole-wheat flour tortillas. Vegetables Fresh or frozen vegetables (raw, steamed, roasted, or grilled). Low-sodium or reduced-sodium tomato and vegetable juice. Low-sodium or reduced-sodium tomato sauce and tomato paste. Low-sodium or reduced-sodium canned vegetables. Fruits All fresh, dried, or frozen fruit. Canned fruit in natural juice (without  added sugar). Meat and other protein foods Skinless chicken or turkey. Ground chicken or turkey. Pork with fat trimmed off. Fish and seafood. Egg whites. Dried beans, peas, or lentils. Unsalted nuts, nut butters, and seeds. Unsalted canned beans. Lean cuts of beef with fat trimmed off. Low-sodium, lean deli meat. Dairy Low-fat (1%) or fat-free (skim) milk. Fat-free, low-fat, or reduced-fat cheeses. Nonfat, low-sodium ricotta or cottage cheese. Low-fat or nonfat yogurt. Low-fat, low-sodium cheese. Fats and oils Soft margarine without trans fats. Vegetable oil. Low-fat, reduced-fat, or light mayonnaise and salad dressings (reduced-sodium). Canola, safflower, olive, soybean, and sunflower oils. Avocado. Seasoning and other foods Herbs. Spices. Seasoning mixes without salt. Unsalted popcorn and pretzels. Fat-free sweets. What foods are not recommended? The items listed may not be a complete list. Talk with your dietitian about what dietary choices are best for you. Grains Baked goods made with fat, such as croissants, muffins, or some breads. Dry pasta or rice meal packs. Vegetables Creamed or fried vegetables. Vegetables in a cheese sauce. Regular canned vegetables (not low-sodium or reduced-sodium). Regular canned tomato sauce and paste (not low-sodium or reduced-sodium). Regular tomato and vegetable juice (not low-sodium or reduced-sodium). Pickles. Olives. Fruits Canned fruit in a light or heavy syrup. Fried fruit. Fruit in cream or butter sauce. Meat and other protein foods Fatty cuts of meat. Ribs. Fried meat. Bacon. Sausage. Bologna and other processed lunch meats. Salami. Fatback. Hotdogs. Bratwurst. Salted nuts and seeds. Canned beans with added salt. Canned or smoked fish. Whole eggs or egg yolks. Chicken or turkey with skin. Dairy Whole or 2% milk, cream, and half-and-half. Whole or full-fat cream cheese. Whole-fat or sweetened yogurt. Full-fat cheese. Nondairy creamers. Whipped toppings.  Processed cheese and cheese spreads. Fats and oils Butter. Stick margarine. Lard. Shortening. Ghee. Bacon fat. Tropical oils, such as coconut, palm kernel, or palm oil. Seasoning and other foods Salted popcorn and pretzels. Onion salt, garlic salt, seasoned salt, table salt, and sea salt. Worcestershire sauce. Tartar sauce. Barbecue sauce. Teriyaki sauce. Soy sauce, including reduced-sodium. Steak sauce. Canned and packaged gravies. Fish sauce. Oyster sauce. Cocktail sauce. Horseradish that you find on the shelf. Ketchup. Mustard. Meat flavorings and tenderizers. Bouillon cubes. Hot sauce and Tabasco sauce. Premade or packaged marinades. Premade or packaged taco seasonings. Relishes. Regular salad dressings. Where to find more information:  National Heart, Lung, and Blood Institute: www.nhlbi.nih.gov  American Heart Association: www.heart.org Summary  The DASH eating plan is a healthy eating plan that has been shown to reduce high blood pressure (hypertension). It may also reduce your risk for type 2 diabetes, heart disease, and stroke.  With the DASH eating plan, you should limit salt (sodium) intake to 2,300 mg a day. If you have hypertension, you may need to reduce your sodium intake to 1,500 mg a day.  When on the DASH eating plan, aim to eat more fresh fruits and vegetables, whole grains, lean proteins, low-fat dairy, and heart-healthy fats.  Work with your health care provider or diet and nutrition specialist (dietitian) to adjust your eating plan to your individual   calorie needs. This information is not intended to replace advice given to you by your health care provider. Make sure you discuss any questions you have with your health care provider. Document Released: 11/06/2011 Document Revised: 11/10/2016 Document Reviewed: 11/10/2016 Elsevier Interactive Patient Education  2018 Elsevier Inc.  Gout Gout is painful swelling that can happen in some of your joints. Gout is a type of  arthritis. This condition is caused by having too much uric acid in your body. Uric acid is a chemical that is made when your body breaks down substances called purines. If your body has too much uric acid, sharp crystals can form and build up in your joints. This causes pain and swelling. Gout attacks can happen quickly and be very painful (acute gout). Over time, the attacks can affect more joints and happen more often (chronic gout). Follow these instructions at home: During a Gout Attack  If directed, put ice on the painful area: ? Put ice in a plastic bag. ? Place a towel between your skin and the bag. ? Leave the ice on for 20 minutes, 2-3 times a day.  Rest the joint as much as possible. If the joint is in your leg, you may be given crutches to use.  Raise (elevate) the painful joint above the level of your heart as often as you can.  Drink enough fluids to keep your pee (urine) clear or pale yellow.  Take over-the-counter and prescription medicines only as told by your doctor.  Do not drive or use heavy machinery while taking prescription pain medicine.  Follow instructions from your doctor about what you can or cannot eat and drink.  Return to your normal activities as told by your doctor. Ask your doctor what activities are safe for you. Avoiding Future Gout Attacks  Follow a low-purine diet as told by a specialist (dietitian) or your doctor. Avoid foods and drinks that have a lot of purines, such as: ? Liver. ? Kidney. ? Anchovies. ? Asparagus. ? Herring. ? Mushrooms ? Mussels. ? Beer.  Limit alcohol intake to no more than 1 drink a day for nonpregnant women and 2 drinks a day for men. One drink equals 12 oz of beer, 5 oz of wine, or 1 oz of hard liquor.  Stay at a healthy weight or lose weight if you are overweight. If you want to lose weight, talk with your doctor. It is important that you do not lose weight too fast.  Start or continue an exercise plan as told by  your doctor.  Drink enough fluids to keep your pee clear or pale yellow.  Take over-the-counter and prescription medicines only as told by your doctor.  Keep all follow-up visits as told by your doctor. This is important. Contact a doctor if:  You have another gout attack.  You still have symptoms of a gout attack after10 days of treatment.  You have problems (side effects) because of your medicines.  You have chills or a fever.  You have burning pain when you pee (urinate).  You have pain in your lower back or belly. Get help right away if:  You have very bad pain.  Your pain cannot be controlled.  You cannot pee. This information is not intended to replace advice given to you by your health care provider. Make sure you discuss any questions you have with your health care provider. Document Released: 08/26/2008 Document Revised: 04/24/2016 Document Reviewed: 08/30/2015 Elsevier Interactive Patient Education  2018 Elsevier Inc.  

## 2018-03-11 NOTE — Progress Notes (Signed)
Subjective:  Patient ID: Paul Burke, male    DOB: 08/01/1951  Age: 67 y.o. MRN: [id number]  CC: Follow-up   HPI Paul BayleyGary B Plaut presents for follow-up of his blood pressure anemia and elevated glucose.  He has been busy with the market and has had a hard time getting back in here to see me.  He has been compliant with his allopurinol and colchicine and has had no further gouty attacks.  We will follow-up on his elevated glucose today and check a hemoglobin A1c.  He has been taking an iron pill for his anemia.  His blood pressures been well controlled on his current therapy and he is having no trouble with the medicines he is taking for his blood pressure  Outpatient Medications Prior to Visit  Medication Sig Dispense Refill  . allopurinol (ZYLOPRIM) 100 MG tablet Take 1 tablet (100 mg total) by mouth 2 (two) times daily. 180 tablet 1  . aspirin 81 MG chewable tablet Chew 81 mg by mouth daily.     . colchicine 0.6 MG tablet Take 0.6 mg by mouth daily.     . fluticasone (FLONASE) 50 MCG/ACT nasal spray Place 2 sprays into both nostrils daily. 16 g 6  . levothyroxine (SYNTHROID, LEVOTHROID) 75 MCG tablet Take 1 tablet (75 mcg total) by mouth daily before breakfast. TAKE 1 TABLET BY MOUTH EVERY DAY IN THE MORNING 90 tablet 1  . omeprazole (PRILOSEC) 40 MG capsule Take 1 capsule (40 mg total) by mouth daily. 90 capsule 0  . amLODipine (NORVASC) 10 MG tablet Take 1 tablet (10 mg total) by mouth daily. 90 tablet 0  . lisinopril (PRINIVIL,ZESTRIL) 40 MG tablet Take 1 tablet (40 mg total) by mouth daily. 90 tablet 0   No facility-administered medications prior to visit.     ROS Review of Systems  Constitutional: Negative for fatigue, fever and unexpected weight change.  HENT: Negative.   Eyes: Negative.   Respiratory: Negative for cough and wheezing.   Cardiovascular: Negative.   Gastrointestinal: Negative for abdominal pain, anal bleeding, blood in stool and constipation.  Genitourinary:  Negative.   Musculoskeletal: Negative for gait problem and joint swelling.  Skin: Negative.   Allergic/Immunologic: Negative for immunocompromised state.  Neurological: Negative for weakness and headaches.  Hematological: Does not bruise/bleed easily.  Psychiatric/Behavioral: Negative.     Objective:  BP 120/78 (BP Location: Left Arm, Patient Position: Sitting, Cuff Size: Normal)   Pulse 99   Ht 6' (1.829 m)   Wt 230 lb 2 oz (104.4 kg)   SpO2 97%   BMI 31.21 kg/m   BP Readings from Last 3 Encounters:  03/11/18 120/78  12/22/17 136/90  11/20/17 126/90    Wt Readings from Last 3 Encounters:  03/11/18 230 lb 2 oz (104.4 kg)  12/22/17 236 lb (107 kg)  11/20/17 232 lb 2 oz (105.3 kg)    Physical Exam  Constitutional: He is oriented to person, place, and time. He appears well-developed and well-nourished. No distress.  HENT:  Head: Normocephalic and atraumatic.  Right Ear: External ear normal.  Left Ear: External ear normal.  Mouth/Throat: Oropharynx is clear and moist. No oropharyngeal exudate.  Eyes: Pupils are equal, round, and reactive to light. Conjunctivae and EOM are normal. Right eye exhibits no discharge. Left eye exhibits no discharge. No scleral icterus.  Neck: Normal range of motion. No JVD present. No tracheal deviation present. No thyromegaly present.  Cardiovascular: Normal rate, regular rhythm and normal heart sounds.  Pulmonary/Chest: Effort normal and breath sounds normal.  Abdominal: Bowel sounds are normal.  Lymphadenopathy:    He has no cervical adenopathy.  Neurological: He is alert and oriented to person, place, and time.  Skin: Skin is warm and dry. He is not diaphoretic.  Psychiatric: He has a normal mood and affect. His behavior is normal.    Lab Results  Component Value Date   WBC 5.4 12/22/2017   HGB 12.9 (L) 12/22/2017   HCT 38.9 (L) 12/22/2017   PLT 205.0 12/22/2017   GLUCOSE 133 (H) 11/20/2017   CHOL 147 11/20/2017   TRIG 76.0  11/20/2017   HDL 50.90 11/20/2017   LDLCALC 80 11/20/2017   ALT 28 11/20/2017   AST 34 11/20/2017   NA 140 11/20/2017   K 4.2 11/20/2017   CL 107 11/20/2017   CREATININE 1.42 11/20/2017   BUN 28 (H) 11/20/2017   CO2 23 11/20/2017   TSH 1.40 11/20/2017   PSA 0.79 11/20/2017    Dg Clavicle Left  Result Date: 12/19/2016 CLINICAL DATA:  History of fall with a distal left clavicle fracture 11/30/2016. Subsequent encounter. EXAM: LEFT CLAVICLE - 2+ VIEWS COMPARISON:  Plain films left shoulder 12/03/2016. FINDINGS: Nondisplaced fracture of the distal clavicle is again seen. There is some early healing about the fracture. The acromioclavicular joint is intact with degenerative change noted. Glenohumeral joint is unremarkable. IMPRESSION: Nondisplaced distal left clavicle fracture with early healing. Acromioclavicular osteoarthritis. Electronically Signed   By: Drusilla Kanner M.D.   On: 12/19/2016 12:35    Assessment & Plan:   Braylen was seen today for follow-up.  Diagnoses and all orders for this visit:  Essential hypertension -     CBC -     Basic metabolic panel -     amLODipine (NORVASC) 10 MG tablet; Take 1 tablet (10 mg total) by mouth daily. -     lisinopril (PRINIVIL,ZESTRIL) 40 MG tablet; Take 1 tablet (40 mg total) by mouth daily.  Anemia, unspecified type -     CBC -     Iron, TIBC and Ferritin Panel -     B12 and Folate Panel  Gout without tophus -     Uric acid  Elevated glucose -     Basic metabolic panel -     Hemoglobin A1c   I am having [name] B. Winchel maintain his aspirin, colchicine, omeprazole, fluticasone, levothyroxine, allopurinol, amLODipine, and lisinopril.  Meds ordered this encounter  Medications  . amLODipine (NORVASC) 10 MG tablet    Sig: Take 1 tablet (10 mg total) by mouth daily.    Dispense:  90 tablet    Refill:  0  . lisinopril (PRINIVIL,ZESTRIL) 40 MG tablet    Sig: Take 1 tablet (40 mg total) by mouth daily.    Dispense:  90 tablet     Refill:  0   Patient information was given concerning: htn, gout and anemia.  Follow-up: Return in about 3 months (around 06/10/2018).  Mliss Sax, MD

## 2018-03-12 LAB — IRON,TIBC AND FERRITIN PANEL
%SAT: 61 % (calc) — ABNORMAL HIGH (ref 15–60)
FERRITIN: 168 ng/mL (ref 20–380)
Iron: 211 ug/dL — ABNORMAL HIGH (ref 50–180)
TIBC: 348 ug/dL (ref 250–425)

## 2018-07-19 ENCOUNTER — Other Ambulatory Visit: Payer: Self-pay | Admitting: Family Medicine

## 2018-07-29 ENCOUNTER — Ambulatory Visit (INDEPENDENT_AMBULATORY_CARE_PROVIDER_SITE_OTHER): Payer: Medicare Other | Admitting: Family Medicine

## 2018-07-29 ENCOUNTER — Encounter: Payer: Self-pay | Admitting: Family Medicine

## 2018-07-29 ENCOUNTER — Ambulatory Visit (INDEPENDENT_AMBULATORY_CARE_PROVIDER_SITE_OTHER): Payer: Medicare Other

## 2018-07-29 VITALS — BP 124/80 | HR 98 | Ht 72.0 in | Wt 232.2 lb

## 2018-07-29 DIAGNOSIS — M722 Plantar fascial fibromatosis: Secondary | ICD-10-CM | POA: Diagnosis not present

## 2018-07-29 DIAGNOSIS — E079 Disorder of thyroid, unspecified: Secondary | ICD-10-CM

## 2018-07-29 DIAGNOSIS — I10 Essential (primary) hypertension: Secondary | ICD-10-CM | POA: Diagnosis not present

## 2018-07-29 DIAGNOSIS — M109 Gout, unspecified: Secondary | ICD-10-CM | POA: Diagnosis not present

## 2018-07-29 DIAGNOSIS — D649 Anemia, unspecified: Secondary | ICD-10-CM | POA: Diagnosis not present

## 2018-07-29 LAB — URIC ACID: URIC ACID, SERUM: 5.2 mg/dL (ref 4.0–7.8)

## 2018-07-29 LAB — CBC
HCT: 37.1 % — ABNORMAL LOW (ref 39.0–52.0)
Hemoglobin: 12.3 g/dL — ABNORMAL LOW (ref 13.0–17.0)
MCHC: 33.2 g/dL (ref 30.0–36.0)
MCV: 96.3 fl (ref 78.0–100.0)
Platelets: 188 10*3/uL (ref 150.0–400.0)
RBC: 3.85 Mil/uL — AB (ref 4.22–5.81)
RDW: 15 % (ref 11.5–15.5)
WBC: 6.1 10*3/uL (ref 4.0–10.5)

## 2018-07-29 LAB — BASIC METABOLIC PANEL
BUN: 24 mg/dL — ABNORMAL HIGH (ref 6–23)
CHLORIDE: 106 meq/L (ref 96–112)
CO2: 22 mEq/L (ref 19–32)
CREATININE: 1.53 mg/dL — AB (ref 0.40–1.50)
Calcium: 9.5 mg/dL (ref 8.4–10.5)
GFR: 58.43 mL/min — AB (ref >60.00)
Glucose, Bld: 130 mg/dL — ABNORMAL HIGH (ref 70–99)
POTASSIUM: 3.8 meq/L (ref 3.5–5.1)
Sodium: 138 mEq/L (ref 135–145)

## 2018-07-29 LAB — TSH: TSH: 1.23 u[IU]/mL (ref 0.35–4.50)

## 2018-07-29 MED ORDER — LEVOTHYROXINE SODIUM 75 MCG PO TABS: 75.0000 ug | ORAL_TABLET | Freq: Every day | ORAL | 1 refills | Status: DC

## 2018-07-29 MED ORDER — LISINOPRIL 40 MG PO TABS: 40.0000 mg | ORAL_TABLET | Freq: Every day | ORAL | 0 refills | Status: DC

## 2018-07-29 MED ORDER — AMLODIPINE BESYLATE 10 MG PO TABS: 10.0000 mg | ORAL_TABLET | Freq: Every day | ORAL | 0 refills | Status: DC

## 2018-07-29 MED ORDER — COLCHICINE 0.6 MG PO TABS: 0.6000 mg | ORAL_TABLET | Freq: Two times a day (BID) | ORAL | 3 refills | Status: DC

## 2018-07-29 MED ORDER — ALLOPURINOL 100 MG PO TABS: 100.0000 mg | ORAL_TABLET | Freq: Two times a day (BID) | ORAL | 1 refills | Status: DC

## 2018-07-29 MED ORDER — MELOXICAM 15 MG PO TABS: 15.0000 mg | ORAL_TABLET | Freq: Every day | ORAL | 0 refills | Status: DC

## 2018-07-29 NOTE — Patient Instructions (Signed)
Plantar Fasciitis Plantar fasciitis is a painful foot condition that affects the heel. It occurs when the band of tissue that connects the toes to the heel bone (plantar fascia) becomes irritated. This can happen after exercising too much or doing other repetitive activities (overuse injury). The pain from plantar fasciitis can range from mild irritation to severe pain that makes it difficult for you to walk or move. The pain is usually worse in the morning or after you have been sitting or lying down for a while. What are the causes? This condition may be caused by:  Standing for long periods of time.  Wearing shoes that do not fit.  Doing high-impact activities, including running, aerobics, and ballet.  Being overweight.  Having an abnormal way of walking (gait).  Having tight calf muscles.  Having high arches in your feet.  Starting a new athletic activity.  What are the signs or symptoms? The main symptom of this condition is heel pain. Other symptoms include:  Pain that gets worse after activity or exercise.  Pain that is worse in the morning or after resting.  Pain that goes away after you walk for a few minutes.  How is this diagnosed? This condition may be diagnosed based on your signs and symptoms. Your health care provider will also do a physical exam to check for:  A tender area on the bottom of your foot.  A high arch in your foot.  Pain when you move your foot.  Difficulty moving your foot.  You may also need to have imaging studies to confirm the diagnosis. These can include:  X-rays.  Ultrasound.  MRI.  How is this treated? Treatment for plantar fasciitis depends on the severity of the condition. Your treatment may include:  Rest, ice, and over-the-counter pain medicines to manage your pain.  Exercises to stretch your calves and your plantar fascia.  A splint that holds your foot in a stretched, upward position while you sleep (night  splint).  Physical therapy to relieve symptoms and prevent problems in the future.  Cortisone injections to relieve severe pain.  Extracorporeal shock wave therapy (ESWT) to stimulate damaged plantar fascia with electrical impulses. It is often used as a last resort before surgery.  Surgery, if other treatments have not worked after 12 months.  Follow these instructions at home:  Take medicines only as directed by your health care provider.  Avoid activities that cause pain.  Roll the bottom of your foot over a bag of ice or a bottle of cold water. Do this for 20 minutes, 3-4 times a day.  Perform simple stretches as directed by your health care provider.  Try wearing athletic shoes with air-sole or gel-sole cushions or soft shoe inserts.  Wear a night splint while sleeping, if directed by your health care provider.  Keep all follow-up appointments with your health care provider. How is this prevented?  Do not perform exercises or activities that cause heel pain.  Consider finding low-impact activities if you continue to have problems.  Lose weight if you need to. The best way to prevent plantar fasciitis is to avoid the activities that aggravate your plantar fascia. Contact a health care provider if:  Your symptoms do not go away after treatment with home care measures.  Your pain gets worse.  Your pain affects your ability to move or do your daily activities. This information is not intended to replace advice given to you by your health care provider. Make sure you   discuss any questions you have with your health care provider. Document Released: 08/12/2001 Document Revised: 04/21/2016 Document Reviewed: 09/27/2014 Elsevier Interactive Patient Education  2018 [employer]  Gout Gout is painful swelling that can happen in some of your joints. Gout is a type of arthritis. This condition is caused by having too much uric acid in your body. Uric acid is a chemical that is  made when your body breaks down substances called purines. If your body has too much uric acid, sharp crystals can form and build up in your joints. This causes pain and swelling. Gout attacks can happen quickly and be very painful (acute gout). Over time, the attacks can affect more joints and happen more often (chronic gout). Follow these instructions at home: During a Gout Attack  If directed, put ice on the painful area: ? Put ice in a plastic bag. ? Place a towel between your skin and the bag. ? Leave the ice on for 20 minutes, 2-3 times a day.  Rest the joint as much as possible. If the joint is in your leg, you may be given crutches to use.  Raise (elevate) the painful joint above the level of your heart as often as you can.  Drink enough fluids to keep your pee (urine) clear or pale yellow.  Take over-the-counter and prescription medicines only as told by your doctor.  Do not drive or use heavy machinery while taking prescription pain medicine.  Follow instructions from your doctor about what you can or cannot eat and drink.  Return to your normal activities as told by your doctor. Ask your doctor what activities are safe for you. Avoiding Future Gout Attacks  Follow a low-purine diet as told by a specialist (dietitian) or your doctor. Avoid foods and drinks that have a lot of purines, such as: ? Liver. ? Kidney. ? Anchovies. ? Asparagus. ? Herring. ? Mushrooms ? Mussels. ? Beer.  Limit alcohol intake to no more than 1 drink a day for nonpregnant women and 2 drinks a day for men. One drink equals 12 oz of beer, 5 oz of wine, or 1 oz of hard liquor.  Stay at a healthy weight or lose weight if you are overweight. If you want to lose weight, talk with your doctor. It is important that you do not lose weight too fast.  Start or continue an exercise plan as told by your doctor.  Drink enough fluids to keep your pee clear or pale yellow.  Take over-the-counter and  prescription medicines only as told by your doctor.  Keep all follow-up visits as told by your doctor. This is important. Contact a doctor if:  You have another gout attack.  You still have symptoms of a gout attack after10 days of treatment.  You have problems (side effects) because of your medicines.  You have chills or a fever.  You have burning pain when you pee (urinate).  You have pain in your lower back or belly. Get help right away if:  You have very bad pain.  Your pain cannot be controlled.  You cannot pee. This information is not intended to replace advice given to you by your health care provider. Make sure you discuss any questions you have with your health care provider. Document Released: 08/26/2008 Document Revised: 04/24/2016 Document Reviewed: 08/30/2015 Elsevier Interactive Patient Education  [employer]2018 Elsevier Inc.

## 2018-07-29 NOTE — Progress Notes (Addendum)
Subjective:  Patient ID: Paul Burke, male    DOB: 09-Jun-1951  Age: 67 y.o. MRN: [id number]  CC: pain in the right heel   HPI BENSON PORCARO presents for evaluation of right heel pain.  Seems to be worse after he is rested for a while or first thing in the morning.  There was no recent injury to this area.  His skin issues are little over a year old.  He has been taking his blood pressure medicines as directed and the blood pressure is well controlled.  He is no longer taking the colchicine daily but needs a refill for rare attacks.  He continues to take his allopurinol daily.  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  He claims compliance with his thyroid medicine.  Outpatient Medications Prior to Visit  Medication Sig Dispense Refill  . aspirin 81 MG chewable tablet Chew 81 mg by mouth daily.     . fluticasone (FLONASE) 50 MCG/ACT nasal spray Place 2 sprays into both nostrils daily. 16 g 6  . omeprazole (PRILOSEC) 40 MG capsule Take 1 capsule (40 mg total) by mouth daily. 90 capsule 0  . allopurinol (ZYLOPRIM) 100 MG tablet Take 1 tablet (100 mg total) by mouth 2 (two) times daily. 180 tablet 1  . amLODipine (NORVASC) 10 MG tablet Take 1 tablet (10 mg total) by mouth daily. 90 tablet 0  . colchicine 0.6 MG tablet Take 0.6 mg by mouth daily.     [name] levothyroxine (SYNTHROID, LEVOTHROID) 75 MCG tablet Take 1 tablet (75 mcg total) by mouth daily before breakfast. 90 tablet 1  . lisinopril (PRINIVIL,ZESTRIL) 40 MG tablet Take 1 tablet (40 mg total) by mouth daily. 90 tablet 0   No facility-administered medications prior to visit.     ROS Review of Systems  Constitutional: Negative for diaphoresis, fatigue, fever and unexpected weight change.  HENT: Negative.   Eyes: Negative for photophobia and visual disturbance.  Respiratory: Negative.   Cardiovascular: Negative.   Gastrointestinal: Negative.   Endocrine: Negative for cold intolerance  and heat intolerance.  Genitourinary: Negative.   Musculoskeletal: Positive for arthralgias and gait problem.  Skin: Negative for pallor and rash.  Hematological: Negative.   Psychiatric/Behavioral: Negative.     Objective:  BP 124/80   Pulse 98   Ht 6' (1.829 m)   Wt 232 lb 4 oz (105.3 kg)   SpO2 98%   BMI 31.50 kg/m   BP Readings from Last 3 Encounters:  07/29/18 124/80  03/11/18 120/78  12/22/17 136/90    Wt Readings from Last 3 Encounters:  07/29/18 232 lb 4 oz (105.3 kg)  03/11/18 230 lb 2 oz (104.4 kg)  12/22/17 236 lb (107 kg)    Physical Exam  Constitutional: He is oriented to person, place, and time. He appears well-developed and well-nourished. No distress.  HENT:  Head: Normocephalic and atraumatic.  Right Ear: External ear normal.  Left Ear: External ear normal.  Mouth/Throat: Oropharynx is clear and moist. No oropharyngeal exudate.  Eyes: Pupils are equal, round, and reactive to light. Conjunctivae and EOM are normal. Right eye exhibits no discharge. Left eye exhibits no discharge. No scleral icterus.  Neck: Neck supple. No JVD present. No tracheal deviation present. No thyromegaly present.  Cardiovascular: Normal rate, regular rhythm and normal heart sounds.  Pulses:      Dorsalis pedis pulses are 2+ on the right side, and 2+  on the left side.       Posterior tibial pulses are 1+ on the right side, and 1+ on the left side.  Pulmonary/Chest: Effort normal and breath sounds normal.  Abdominal: Soft. Bowel sounds are normal.  Musculoskeletal:       Feet:  Neurological: He is alert and oriented to person, place, and time.  Skin: Skin is warm and dry. He is not diaphoretic.  Psychiatric: He has a normal mood and affect. His behavior is normal.    Lab Results  Component Value Date   WBC 6.1 07/29/2018   HGB 12.3 (L) 07/29/2018   HCT 37.1 (L) 07/29/2018   PLT 188.0 07/29/2018   GLUCOSE 130 (H) 07/29/2018   CHOL 147 11/20/2017   TRIG 76.0 11/20/2017     HDL 50.90 11/20/2017   LDLCALC 80 11/20/2017   ALT 28 11/20/2017   AST 34 11/20/2017   NA 138 07/29/2018   K 3.8 07/29/2018   CL 106 07/29/2018   CREATININE 1.53 (H) 07/29/2018   BUN 24 (H) 07/29/2018   CO2 22 07/29/2018   TSH 1.23 07/29/2018   PSA 0.79 11/20/2017   HGBA1C 6.2 03/11/2018    Dg Clavicle Left  Result Date: 12/19/2016 CLINICAL DATA:  History of fall with a distal left clavicle fracture 11/30/2016. Subsequent encounter. EXAM: LEFT CLAVICLE - 2+ VIEWS COMPARISON:  Plain films left shoulder 12/03/2016. FINDINGS: Nondisplaced fracture of the distal clavicle is again seen. There is some early healing about the fracture. The acromioclavicular joint is intact with degenerative change noted. Glenohumeral joint is unremarkable. IMPRESSION: Nondisplaced distal left clavicle fracture with early healing. Acromioclavicular osteoarthritis. Electronically Signed   By: Drusilla Kanner M.D.   On: 12/19/2016 12:35    Assessment & Plan:   Benjimen was seen today for pain in the right heel.  Diagnoses and all orders for this visit:  Essential hypertension -     Basic metabolic panel -     CBC -     Cancel: Urinalysis, Routine w reflex microscopic -     amLODipine (NORVASC) 10 MG tablet; Take 1 tablet (10 mg total) by mouth daily. -     lisinopril (PRINIVIL,ZESTRIL) 40 MG tablet; Take 1 tablet (40 mg total) by mouth daily. -     Urinalysis, Routine w reflex microscopic; Future  Thyroid disease -     TSH -     levothyroxine (SYNTHROID, LEVOTHROID) 75 MCG tablet; Take 1 tablet (75 mcg total) by mouth daily before breakfast.  Gout without tophus -     Uric acid -     allopurinol (ZYLOPRIM) 100 MG tablet; Take 1 tablet (100 mg total) by mouth 2 (two) times daily. -     colchicine 0.6 MG tablet; Take 1 tablet (0.6 mg total) by mouth 2 (two) times daily. As needed for attacks.  Plantar fasciitis of right foot -     DG Foot Complete Right; Future -     meloxicam (MOBIC) 15 MG tablet;  Take 1 tablet (15 mg total) by mouth daily. For 14 days and then as needed. -     DG Foot Complete Right  Anemia, unspecified type -     CBC -     Iron, TIBC and Ferritin Panel -     ferrous sulfate 325 (65 FE) MG tablet; Take 1 tablet (325 mg total) by mouth daily with breakfast.   I have changed [name] B. Kronick's colchicine. I am also having him start on meloxicam and  ferrous sulfate. Additionally, I am having him maintain his aspirin, omeprazole, fluticasone, allopurinol, amLODipine, levothyroxine, and lisinopril.  Meds ordered this encounter  Medications  . allopurinol (ZYLOPRIM) 100 MG tablet    Sig: Take 1 tablet (100 mg total) by mouth 2 (two) times daily.    Dispense:  180 tablet    Refill:  1  . amLODipine (NORVASC) 10 MG tablet    Sig: Take 1 tablet (10 mg total) by mouth daily.    Dispense:  90 tablet    Refill:  0  . levothyroxine (SYNTHROID, LEVOTHROID) 75 MCG tablet    Sig: Take 1 tablet (75 mcg total) by mouth daily before breakfast.    Dispense:  90 tablet    Refill:  1  . lisinopril (PRINIVIL,ZESTRIL) 40 MG tablet    Sig: Take 1 tablet (40 mg total) by mouth daily.    Dispense:  90 tablet    Refill:  0  . colchicine 0.6 MG tablet    Sig: Take 1 tablet (0.6 mg total) by mouth 2 (two) times daily. As needed for attacks.    Dispense:  60 tablet    Refill:  3  . meloxicam (MOBIC) 15 MG tablet    Sig: Take 1 tablet (15 mg total) by mouth daily. For 14 days and then as needed.    Dispense:  30 tablet    Refill:  0  . ferrous sulfate 325 (65 FE) MG tablet    Sig: Take 1 tablet (325 mg total) by mouth daily with breakfast.    Dispense:  90 tablet    Refill:  1   Suggested the patient replace his shoes.  Will consider sports medicine referral for orthotics.  Patient was given anticipatory guidance on foot stretches.  He will use meloxicam for 14 days.  Blood work is pending for the remainder of his lab work.  Reminded him that for now I need to see him every 3  months.  Follow-up: Return in about 3 months (around 10/29/2018), or if symptoms worsen or fail to improve.  Mliss SaxWilliam Alfred Manhattan Mccuen, MD

## 2018-07-30 LAB — IRON,TIBC AND FERRITIN PANEL
%SAT: 40 % (calc) (ref 20–48)
Ferritin: 169 ng/mL (ref 24–380)
Iron: 132 ug/dL (ref 50–180)
TIBC: 333 ug/dL (ref 250–425)

## 2018-07-30 MED ORDER — FERROUS SULFATE 325 (65 FE) MG PO TABS: 325.0000 mg | ORAL_TABLET | Freq: Every day | ORAL | 1 refills | Status: DC

## 2018-07-30 NOTE — Addendum Note (Signed)
Addended by: Andrez GrimeKREMER, WILLIAM A on: 07/30/2018 03:07 PM   Modules accepted: Orders

## 2018-09-16 ENCOUNTER — Other Ambulatory Visit: Payer: Self-pay | Admitting: Family Medicine

## 2018-09-16 DIAGNOSIS — K219 Gastro-esophageal reflux disease without esophagitis: Secondary | ICD-10-CM

## 2018-09-16 MED ORDER — OMEPRAZOLE 40 MG PO CPDR: 40.0000 mg | DELAYED_RELEASE_CAPSULE | Freq: Every day | ORAL | 0 refills | Status: DC

## 2018-09-16 NOTE — Telephone Encounter (Signed)
Copied from CRM [id number]. Topic: Quick Communication - See Telephone Encounter >> Sep 16, 2018  1:50 PM Windy Kalata, NT wrote: CRM for notification. See Telephone encounter for: 09/16/18.  Patient is requesting a refill on omeprazole (PRILOSEC) 40 MG capsule.  [id number] - [place], [place] - [address] [place] [address] Phone: [phone] Fax: [phone]

## 2018-10-05 ENCOUNTER — Ambulatory Visit (INDEPENDENT_AMBULATORY_CARE_PROVIDER_SITE_OTHER): Payer: Medicare Other

## 2018-10-05 ENCOUNTER — Other Ambulatory Visit: Payer: Self-pay | Admitting: Family Medicine

## 2018-10-05 ENCOUNTER — Ambulatory Visit (INDEPENDENT_AMBULATORY_CARE_PROVIDER_SITE_OTHER): Payer: Medicare Other | Admitting: Family Medicine

## 2018-10-05 ENCOUNTER — Encounter: Payer: Self-pay | Admitting: Family Medicine

## 2018-10-05 VITALS — BP 124/82 | HR 110 | Temp 97.9°F | Ht 72.0 in | Wt 226.4 lb

## 2018-10-05 DIAGNOSIS — M25551 Pain in right hip: Secondary | ICD-10-CM | POA: Diagnosis not present

## 2018-10-05 MED ORDER — KETOROLAC TROMETHAMINE 60 MG/2ML IM SOLN
60.0000 mg | Freq: Once | INTRAMUSCULAR | Status: AC
  Administered 2018-10-05: 60 mg via INTRAMUSCULAR

## 2018-10-05 MED ORDER — METHOCARBAMOL 500 MG PO TABS: 500.0000 mg | ORAL_TABLET | Freq: Three times a day (TID) | ORAL | 1 refills | Status: DC | PRN

## 2018-10-05 NOTE — Progress Notes (Signed)
Subjective:  Patient ID: Paul Burke, male    DOB: 05/22/1951  Age: 67 y.o. MRN: [id number]  CC: right back pain (patient was working [employer] a couple weeks ago and thinks he may have twisted his back. )   HPI Paul Burke presents for evaluation and treatment of pain in his right buttock area that radiates around to his groin.  This is waxed and waned over the last few weeks and seems to finally be improving somewhat but he would like it evaluated.  Denies pain in the lower back per se or pain with ambulation.  No specific injury other than that he had been working [employer] prior to onset.  He had been doing a lot of lifting tugging and pulling at that event.  Patient denies excessive caffeine use or recent alcohol ingestion.  He will drink 2-3 beers while watching football games but usually not during the week.  Patient has been taking his thyroid medicine as directed as well as his iron pills twice a day.  Outpatient Medications Prior to Visit  Medication Sig Dispense Refill  . allopurinol (ZYLOPRIM) 100 MG tablet Take 1 tablet (100 mg total) by mouth 2 (two) times daily. 180 tablet 1  . amLODipine (NORVASC) 10 MG tablet Take 1 tablet (10 mg total) by mouth daily. 90 tablet 0  . aspirin 81 MG chewable tablet Chew 81 mg by mouth daily.     . colchicine 0.6 MG tablet Take 1 tablet (0.6 mg total) by mouth 2 (two) times daily. As needed for attacks. 60 tablet 3  . ferrous sulfate 325 (65 FE) MG tablet Take 1 tablet (325 mg total) by mouth daily with breakfast. 90 tablet 1  . fluticasone (FLONASE) 50 MCG/ACT nasal spray Place 2 sprays into both nostrils daily. 16 g 6  . levothyroxine (SYNTHROID, LEVOTHROID) 75 MCG tablet Take 1 tablet (75 mcg total) by mouth daily before breakfast. 90 tablet 1  . lisinopril (PRINIVIL,ZESTRIL) 40 MG tablet Take 1 tablet (40 mg total) by mouth daily. 90 tablet 0  . meloxicam (MOBIC) 15 MG tablet Take 1 tablet (15 mg total) by mouth  daily. For 14 days and then as needed. 30 tablet 0  . omeprazole (PRILOSEC) 40 MG capsule Take 1 capsule (40 mg total) by mouth daily. 90 capsule 0   No facility-administered medications prior to visit.     ROS Review of Systems  Constitutional: Negative.   HENT: Negative.   Eyes: Negative.   Respiratory: Negative.  Negative for shortness of breath.   Cardiovascular: Negative for chest pain, palpitations and leg swelling.  Gastrointestinal: Negative.   Endocrine: Negative for polyphagia and polyuria.  Genitourinary: Negative.   Musculoskeletal: Positive for arthralgias. Negative for gait problem and myalgias.  Skin: Negative.   Allergic/Immunologic: Negative for immunocompromised state.  Neurological: Negative for light-headedness and numbness.  Hematological: Does not bruise/bleed easily.  Psychiatric/Behavioral: Negative.     Objective:  BP 124/82 (BP Location: Right Arm, Patient Position: Sitting, Cuff Size: Normal)   Pulse (!) 110   Temp 97.9 F (36.6 C) (Oral)   Ht 6' (1.829 m)   Wt 226 lb 6 oz (102.7 kg)   SpO2 98%   BMI 30.70 kg/m   BP Readings from Last 3 Encounters:  10/05/18 124/82  07/29/18 124/80  03/11/18 120/78    Wt Readings from Last 3 Encounters:  10/05/18 226 lb 6 oz (102.7 kg)  07/29/18 232 lb 4 oz (105.3 kg)  03/11/18 230 lb 2 oz (104.4 kg)    Physical Exam  Constitutional: He is oriented to person, place, and time. He appears well-developed and well-nourished. No distress.  HENT:  Head: Normocephalic and atraumatic.  Right Ear: External ear normal.  Left Ear: External ear normal.  Eyes: Right eye exhibits no discharge. Left eye exhibits no discharge. No scleral icterus.  Neck: Neck supple. No JVD present. No tracheal deviation present. No thyromegaly present.  Cardiovascular: Normal rate, regular rhythm and normal heart sounds.  Pulmonary/Chest: Effort normal and breath sounds normal.  Musculoskeletal:       Right hip: He exhibits  decreased range of motion. He exhibits normal strength, no tenderness and no bony tenderness.       Left hip: He exhibits normal range of motion, normal strength, no tenderness and no bony tenderness.       Lumbar back: He exhibits normal range of motion, no tenderness and no bony tenderness.  Lymphadenopathy:    He has no cervical adenopathy.  Neurological: He is alert and oriented to person, place, and time.  Reflex Scores:      Patellar reflexes are 1+ on the right side and 1+ on the left side.      Achilles reflexes are 1+ on the right side and 1+ on the left side. Neg dural tension signs.    Skin: He is not diaphoretic.    Lab Results  Component Value Date   WBC 6.1 07/29/2018   HGB 12.3 (L) 07/29/2018   HCT 37.1 (L) 07/29/2018   PLT 188.0 07/29/2018   GLUCOSE 130 (H) 07/29/2018   CHOL 147 11/20/2017   TRIG 76.0 11/20/2017   HDL 50.90 11/20/2017   LDLCALC 80 11/20/2017   ALT 28 11/20/2017   AST 34 11/20/2017   NA 138 07/29/2018   K 3.8 07/29/2018   CL 106 07/29/2018   CREATININE 1.53 (H) 07/29/2018   BUN 24 (H) 07/29/2018   CO2 22 07/29/2018   TSH 1.23 07/29/2018   PSA 0.79 11/20/2017   HGBA1C 6.2 03/11/2018    Dg Clavicle Left  Result Date: 12/19/2016 CLINICAL DATA:  History of fall with a distal left clavicle fracture 11/30/2016. Subsequent encounter. EXAM: LEFT CLAVICLE - 2+ VIEWS COMPARISON:  Plain films left shoulder 12/03/2016. FINDINGS: Nondisplaced fracture of the distal clavicle is again seen. There is some early healing about the fracture. The acromioclavicular joint is intact with degenerative change noted. Glenohumeral joint is unremarkable. IMPRESSION: Nondisplaced distal left clavicle fracture with early healing. Acromioclavicular osteoarthritis. Electronically Signed   By: Drusilla Kanner M.D.   On: 12/19/2016 12:35    Assessment & Plan:   Paul Burke was seen today for right back pain.  Diagnoses and all orders for this visit:  Pain of right hip  joint -     ketorolac (TORADOL) injection 60 mg -     DG HIP UNILAT WITH PELVIS 2-3 VIEWS LEFT; Future -     methocarbamol (ROBAXIN) 500 MG tablet; Take 1 tablet (500 mg total) by mouth every 8 (eight) hours as needed for muscle spasms.   I am having Paul Burke B. Fotheringham start on methocarbamol. I am also having him maintain his aspirin, fluticasone, allopurinol, amLODipine, levothyroxine, lisinopril, colchicine, meloxicam, ferrous sulfate, and omeprazole. We administered ketorolac.  Meds ordered this encounter  Medications  . ketorolac (TORADOL) injection 60 mg  . methocarbamol (ROBAXIN) 500 MG tablet    Sig: Take 1 tablet (500 mg total) by mouth every 8 (eight) hours as  needed for muscle spasms.    Dispense:  30 tablet    Refill:  1   Patient will use meloxicam and Robaxin as needed.  Checking x-rays of his right hip today.  We will follow-up thyroid and anemia in 3 weeks with schedule appointment.  Follow-up: Return scheduled appointment.Mliss Sax, MD

## 2018-10-12 ENCOUNTER — Encounter: Payer: Self-pay | Admitting: Family Medicine

## 2018-10-12 ENCOUNTER — Telehealth: Payer: Self-pay

## 2018-10-12 DIAGNOSIS — M722 Plantar fascial fibromatosis: Secondary | ICD-10-CM

## 2018-10-12 MED ORDER — MELOXICAM 15 MG PO TABS: 15.0000 mg | ORAL_TABLET | Freq: Every day | ORAL | 1 refills | Status: DC

## 2018-10-12 NOTE — Telephone Encounter (Signed)
Xrays were okay. There were no fractures or bone on bone arthritis.  Patient should also be taking his meloxicam with the robaxin.

## 2018-10-12 NOTE — Telephone Encounter (Signed)
Copied from CRM [id number]. Topic: Quick Communication - See Telephone Encounter >> Oct 12, 2018  8:44 AM Mare LoanBurton, Donna F wrote: Pt is calling to see what needs to be done next for his right hip pain -he was told last week that he had arthiritis and was given a muscle relaxer and it is not working and would like to know what to do next   Best number [phone]

## 2018-10-12 NOTE — Telephone Encounter (Signed)
I called and spoke with patient, we went over information below. Rx for Meloxicam refilled. Patient will let us know how he is feeling after trying both medications.

## 2018-10-12 NOTE — Addendum Note (Signed)
Addended by: Marcell AngerSELF, SARAH E on: 10/12/2018 11:42 AM   Modules accepted: Orders

## 2018-11-04 ENCOUNTER — Ambulatory Visit: Payer: Medicare Other | Admitting: Family Medicine

## 2018-11-10 NOTE — Progress Notes (Addendum)
Established Patient Office Visit  Subjective:  Patient ID: Paul Burke, male    DOB: September 25, 1951  Age: 67 y.o. MRN: [id number]  CC:  Chief Complaint  Patient presents with  . Follow-up    HPI Paul Burke presents for follow up on his hypertension, gout, anemia, and hyperlipidemia. He has been taking his medications as directed.   Patient is here for follow-up of the above.  Hypertension is well controlled with the amlodipine and lisinopril.  He is taking his Synthroid daily in the morning on a fasting stomach.  He has not had any gouty attacks and takes that allopurinol twice daily as directed.  Hip pain has responded to the meloxicam and he just refilled it.  His hip films had shown mild arthritis but there was some hardening of the arteries noted.  He does have a rather favorable lipid profile.  History of hep C that has been treated with Harvoni. Overall RNA load on the chart.  Longstanding history of an elevated heart rate.  He says that his pulse rate tends to go up when he rushes as he did try to make it to the appointment today on time.  Past Medical History:  Diagnosis Date  . Gout   . Hypertension   . Thyroid disease     History reviewed. No pertinent surgical history.  History reviewed. No pertinent family history.  Social History   Socioeconomic History  . Marital status: Single    Spouse name: Not on file  . Number of children: Not on file  . Years of education: Not on file  . Highest education level: Not on file  Occupational History  . Not on file  Social Needs  . Financial resource strain: Not on file  . Food insecurity:    Worry: Not on file    Inability: Not on file  . Transportation needs:    Medical: Not on file    Non-medical: Not on file  Tobacco Use  . Smoking status: Former Games developer  . Smokeless tobacco: Never Used  Substance and Sexual Activity  . Alcohol use: Yes    Comment: occassionally on the weekinds.  . Drug use: Not on file  .  Sexual activity: Not on file  Lifestyle  . Physical activity:    Days per week: Not on file    Minutes per session: Not on file  . Stress: Not on file  Relationships  . Social connections:    Talks on phone: Not on file    Gets together: Not on file    Attends religious service: Not on file    Active member of club or organization: Not on file    Attends meetings of clubs or organizations: Not on file    Relationship status: Not on file  . Intimate partner violence:    Fear of current or ex partner: Not on file    Emotionally abused: Not on file    Physically abused: Not on file    Forced sexual activity: Not on file  Other Topics Concern  . Not on file  Social History Narrative  . Not on file    Outpatient Medications Prior to Visit  Medication Sig Dispense Refill  . aspirin 81 MG chewable tablet Chew 81 mg by mouth daily.     . fluticasone (FLONASE) 50 MCG/ACT nasal spray Place 2 sprays into both nostrils daily. 16 g 6  . omeprazole (PRILOSEC) 40 MG capsule Take 1 capsule (40 mg  total) by mouth daily. 90 capsule 0  . allopurinol (ZYLOPRIM) 100 MG tablet Take 1 tablet (100 mg total) by mouth 2 (two) times daily. 180 tablet 1  . amLODipine (NORVASC) 10 MG tablet Take 1 tablet (10 mg total) by mouth daily. 90 tablet 0  . colchicine 0.6 MG tablet Take 1 tablet (0.6 mg total) by mouth 2 (two) times daily. As needed for attacks. 60 tablet 3  . ferrous sulfate 325 (65 FE) MG tablet Take 1 tablet (325 mg total) by mouth daily with breakfast. 90 tablet 1  . levothyroxine (SYNTHROID, LEVOTHROID) 75 MCG tablet Take 1 tablet (75 mcg total) by mouth daily before breakfast. 90 tablet 1  . lisinopril (PRINIVIL,ZESTRIL) 40 MG tablet Take 1 tablet (40 mg total) by mouth daily. 90 tablet 0  . meloxicam (MOBIC) 15 MG tablet Take 1 tablet (15 mg total) by mouth daily. For 14 days and then as needed. 30 tablet 1  . methocarbamol (ROBAXIN) 500 MG tablet Take 1 tablet (500 mg total) by mouth every 8  (eight) hours as needed for muscle spasms. 30 tablet 1   No facility-administered medications prior to visit.     No Known Allergies  ROS Review of Systems  Constitutional: Negative.   HENT: Negative.   Eyes: Negative for photophobia and visual disturbance.  Respiratory: Negative.   Cardiovascular: Negative.   Gastrointestinal: Negative.   Endocrine: Negative for polyphagia and polyuria.  Genitourinary: Negative.   Musculoskeletal: Positive for arthralgias. Negative for gait problem and joint swelling.  Skin: Negative.   Allergic/Immunologic: Negative for immunocompromised state.  Neurological: Negative for seizures, light-headedness and numbness.  Hematological: Does not bruise/bleed easily.  Psychiatric/Behavioral: Negative.       Objective:    Physical Exam  Constitutional: He is oriented to person, place, and time. He appears well-developed and well-nourished. No distress.  HENT:  Head: Normocephalic and atraumatic.  Right Ear: External ear normal.  Left Ear: External ear normal.  Mouth/Throat: Oropharynx is clear and moist. No oropharyngeal exudate.  Eyes: Conjunctivae are normal. Right eye exhibits no discharge. Left eye exhibits no discharge. No scleral icterus.  Neck: Neck supple. No JVD present. No tracheal deviation present. No thyromegaly present.  Cardiovascular: Normal rate, regular rhythm and normal heart sounds.  Pulmonary/Chest: Effort normal and breath sounds normal. No stridor.  Abdominal: Soft. Bowel sounds are normal. He exhibits no distension. There is no abdominal tenderness. There is no rebound and no guarding.  Lymphadenopathy:    He has no cervical adenopathy.  Neurological: He is alert and oriented to person, place, and time.  Skin: Skin is warm and dry. He is not diaphoretic.  Psychiatric: He has a normal mood and affect. His behavior is normal.    BP 138/80   Pulse (!) 106   Ht 6' (1.829 m)   Wt 227 lb 2 oz (103 kg)   SpO2 97%   BMI  30.80 kg/m  Wt Readings from Last 3 Encounters:  11/11/18 227 lb 2 oz (103 kg)  10/05/18 226 lb 6 oz (102.7 kg)  07/29/18 232 lb 4 oz (105.3 kg)   BP Readings from Last 3 Encounters:  11/11/18 138/80  10/05/18 124/82  07/29/18 124/80   Health Maintenance Due  Topic Date Due  . TETANUS/TDAP  03/02/1969  . INFLUENZA VACCINE  07/01/2018    There are no preventive care reminders to display for this patient.  Lab Results  Component Value Date   TSH 2.06 11/11/2018   Lab  Results  Component Value Date   WBC 6.0 11/11/2018   HGB 13.7 11/11/2018   HCT 40.1 11/11/2018   MCV 95.6 11/11/2018   PLT 185.0 11/11/2018   Lab Results  Component Value Date   NA 139 11/11/2018   K 3.6 11/11/2018   CO2 23 11/11/2018   GLUCOSE 155 (H) 11/11/2018   BUN 25 (H) 11/11/2018   CREATININE 1.53 (H) 11/11/2018   BILITOT 0.8 11/11/2018   ALKPHOS 60 11/11/2018   AST 31 11/11/2018   ALT 33 11/11/2018   PROT 8.3 11/11/2018   ALBUMIN 4.6 11/11/2018   CALCIUM 9.6 11/11/2018   GFR 58.38 (L) 11/11/2018   Lab Results  Component Value Date   CHOL 179 11/11/2018   Lab Results  Component Value Date   HDL 58.60 11/11/2018   Lab Results  Component Value Date   LDLCALC 99 11/11/2018   Lab Results  Component Value Date   TRIG 108.0 11/11/2018   Lab Results  Component Value Date   CHOLHDL 3 11/11/2018   Lab Results  Component Value Date   HGBA1C 6.2 03/11/2018      Assessment & Plan:   Problem List Items Addressed This Visit      Cardiovascular and Mediastinum   Essential hypertension   Relevant Medications   amLODipine (NORVASC) 10 MG tablet   lisinopril (PRINIVIL,ZESTRIL) 40 MG tablet   atorvastatin (LIPITOR) 20 MG tablet   Other Relevant Orders   CBC (Completed)   Comprehensive metabolic panel (Completed)   Microalbumin / creatinine urine ratio     Digestive   History of hepatitis C   Relevant Orders   Hepatitis C RNA quantitative ([facility])     Endocrine   Thyroid  disease   Relevant Medications   levothyroxine (SYNTHROID, LEVOTHROID) 75 MCG tablet   Other Relevant Orders   TSH (Completed)     Other   Gout without tophus - Primary   Relevant Medications   allopurinol (ZYLOPRIM) 100 MG tablet   colchicine 0.6 MG tablet   Other Relevant Orders   Uric acid (Completed)   Microalbumin / creatinine urine ratio   Hyperlipidemia   Relevant Medications   amLODipine (NORVASC) 10 MG tablet   lisinopril (PRINIVIL,ZESTRIL) 40 MG tablet   atorvastatin (LIPITOR) 20 MG tablet   Other Relevant Orders   LDL cholesterol, direct (Completed)   Lipid panel (Completed)   Anemia   Relevant Medications   ferrous sulfate 325 (65 FE) MG tablet   Other Relevant Orders   CBC (Completed)   Iron, TIBC and Ferritin Panel    Other Visit Diagnoses    Health maintenance alteration       Relevant Orders   PSA (Completed)     The 10-year ASCVD risk score Denman [name], et al., 2013) is: 18.8%   Values used to calculate the score:     Age: 25 years     Sex: Male     Is Non-Hispanic African American: Yes     Diabetic: No     Tobacco smoker: No     Systolic Blood Pressure: 138 mmHg     Is BP treated: Yes     HDL Cholesterol: 58.6 mg/dL     Total Cholesterol: 179 mg/dL Meds ordered this encounter  Medications  . allopurinol (ZYLOPRIM) 100 MG tablet    Sig: Take 1 tablet (100 mg total) by mouth 2 (two) times daily.    Dispense:  180 tablet    Refill:  1  .  amLODipine (NORVASC) 10 MG tablet    Sig: Take 1 tablet (10 mg total) by mouth daily.    Dispense:  90 tablet    Refill:  0  . colchicine 0.6 MG tablet    Sig: Take 1 tablet (0.6 mg total) by mouth 2 (two) times daily. As needed for attacks.    Dispense:  60 tablet    Refill:  3  . ferrous sulfate 325 (65 FE) MG tablet    Sig: Take 1 tablet (325 mg total) by mouth daily with breakfast.    Dispense:  90 tablet    Refill:  1  . lisinopril (PRINIVIL,ZESTRIL) 40 MG tablet    Sig: Take 1 tablet (40 mg total)  by mouth daily.    Dispense:  90 tablet    Refill:  0  . levothyroxine (SYNTHROID, LEVOTHROID) 75 MCG tablet    Sig: Take 1 tablet (75 mcg total) by mouth daily before breakfast.    Dispense:  90 tablet    Refill:  1  . atorvastatin (LIPITOR) 20 MG tablet    Sig: Take 1 tablet (20 mg total) by mouth daily.    Dispense:  90 tablet    Refill:  3    Follow-up: Return in about 6 months (around 05/13/2019), or if symptoms worsen or fail to improve.   Patient was given information on exercising to lose some weight.

## 2018-11-11 ENCOUNTER — Encounter: Payer: Self-pay | Admitting: Family Medicine

## 2018-11-11 ENCOUNTER — Ambulatory Visit (INDEPENDENT_AMBULATORY_CARE_PROVIDER_SITE_OTHER): Payer: Medicare Other | Admitting: Family Medicine

## 2018-11-11 VITALS — BP 138/80 | HR 106 | Ht 72.0 in | Wt 227.1 lb

## 2018-11-11 DIAGNOSIS — D649 Anemia, unspecified: Secondary | ICD-10-CM | POA: Diagnosis not present

## 2018-11-11 DIAGNOSIS — E782 Mixed hyperlipidemia: Secondary | ICD-10-CM | POA: Diagnosis not present

## 2018-11-11 DIAGNOSIS — I10 Essential (primary) hypertension: Secondary | ICD-10-CM | POA: Diagnosis not present

## 2018-11-11 DIAGNOSIS — M109 Gout, unspecified: Secondary | ICD-10-CM

## 2018-11-11 DIAGNOSIS — Z8619 Personal history of other infectious and parasitic diseases: Secondary | ICD-10-CM

## 2018-11-11 DIAGNOSIS — Z789 Other specified health status: Secondary | ICD-10-CM

## 2018-11-11 DIAGNOSIS — E079 Disorder of thyroid, unspecified: Secondary | ICD-10-CM

## 2018-11-11 LAB — TSH: TSH: 2.06 u[IU]/mL (ref 0.35–4.50)

## 2018-11-11 LAB — COMPREHENSIVE METABOLIC PANEL
ALBUMIN: 4.6 g/dL (ref 3.5–5.2)
ALK PHOS: 60 U/L (ref 39–117)
ALT: 33 U/L (ref 0–53)
AST: 31 U/L (ref 0–37)
BUN: 25 mg/dL — ABNORMAL HIGH (ref 6–23)
CO2: 23 meq/L (ref 19–32)
CREATININE: 1.53 mg/dL — AB (ref 0.40–1.50)
Calcium: 9.6 mg/dL (ref 8.4–10.5)
Chloride: 108 mEq/L (ref 96–112)
GFR: 58.38 mL/min — ABNORMAL LOW (ref >60.00)
Glucose, Bld: 155 mg/dL — ABNORMAL HIGH (ref 70–99)
POTASSIUM: 3.6 meq/L (ref 3.5–5.1)
SODIUM: 139 meq/L (ref 135–145)
Total Bilirubin: 0.8 mg/dL (ref 0.2–1.2)
Total Protein: 8.3 g/dL (ref 6.0–8.3)

## 2018-11-11 LAB — CBC
HCT: 40.1 % (ref 39.0–52.0)
HEMOGLOBIN: 13.7 g/dL (ref 13.0–17.0)
MCHC: 34 g/dL (ref 30.0–36.0)
MCV: 95.6 fl (ref 78.0–100.0)
PLATELETS: 185 10*3/uL (ref 150.0–400.0)
RBC: 4.2 Mil/uL — AB (ref 4.22–5.81)
RDW: 13.6 % (ref 11.5–15.5)
WBC: 6 10*3/uL (ref 4.0–10.5)

## 2018-11-11 LAB — LIPID PANEL
CHOL/HDL RATIO: 3
CHOLESTEROL: 179 mg/dL (ref 0–200)
HDL: 58.6 mg/dL (ref >39.00)
LDL Cholesterol: 99 mg/dL (ref 0–99)
NONHDL: 120.38
TRIGLYCERIDES: 108 mg/dL (ref 0.0–149.0)
VLDL: 21.6 mg/dL (ref 0.0–40.0)

## 2018-11-11 LAB — URIC ACID: URIC ACID, SERUM: 5.8 mg/dL (ref 4.0–7.8)

## 2018-11-11 LAB — LDL CHOLESTEROL, DIRECT: LDL DIRECT: 109 mg/dL

## 2018-11-11 LAB — PSA: PSA: 1.21 ng/mL (ref 0.10–4.00)

## 2018-11-11 MED ORDER — ALLOPURINOL 100 MG PO TABS: 100.0000 mg | ORAL_TABLET | Freq: Two times a day (BID) | ORAL | 1 refills | Status: DC

## 2018-11-11 MED ORDER — COLCHICINE 0.6 MG PO TABS: 0.6000 mg | ORAL_TABLET | Freq: Two times a day (BID) | ORAL | 3 refills | Status: DC

## 2018-11-11 MED ORDER — AMLODIPINE BESYLATE 10 MG PO TABS: 10.0000 mg | ORAL_TABLET | Freq: Every day | ORAL | 0 refills | Status: DC

## 2018-11-11 MED ORDER — LISINOPRIL 40 MG PO TABS: 40.0000 mg | ORAL_TABLET | Freq: Every day | ORAL | 0 refills | Status: DC

## 2018-11-11 MED ORDER — FERROUS SULFATE 325 (65 FE) MG PO TABS: 325.0000 mg | ORAL_TABLET | Freq: Every day | ORAL | 1 refills | Status: DC

## 2018-11-11 MED ORDER — ATORVASTATIN CALCIUM 20 MG PO TABS: 20.0000 mg | ORAL_TABLET | Freq: Every day | ORAL | 3 refills | Status: DC

## 2018-11-11 MED ORDER — LEVOTHYROXINE SODIUM 75 MCG PO TABS: 75.0000 ug | ORAL_TABLET | Freq: Every day | ORAL | 1 refills | Status: DC

## 2018-11-11 NOTE — Addendum Note (Signed)
Addended by: Andrez GrimeKREMER, WILLIAM A on: 11/11/2018 02:15 PM   Modules accepted: Orders

## 2018-11-11 NOTE — Patient Instructions (Signed)
Exercising to Lose Weight Exercising can help you to lose weight. In order to lose weight through exercise, you need to do vigorous-intensity exercise. You can tell that you are exercising with vigorous intensity if you are breathing very hard and fast and cannot hold a conversation while exercising. Moderate-intensity exercise helps to maintain your current weight. You can tell that you are exercising at a moderate level if you have a higher heart rate and faster breathing, but you are still able to hold a conversation. How often should I exercise? Choose an activity that you enjoy and set realistic goals. Your health care provider can help you to make an activity plan that works for you. Exercise regularly as directed by your health care provider. This may include:  Doing resistance training twice each week, such as: ? Push-ups. ? Sit-ups. ? Lifting weights. ? Using resistance bands.  Doing a given intensity of exercise for a given amount of time. Choose from these options: ? 150 minutes of moderate-intensity exercise every week. ? 75 minutes of vigorous-intensity exercise every week. ? A mix of moderate-intensity and vigorous-intensity exercise every week.  Children, pregnant women, people who are out of shape, people who are overweight, and older adults may need to consult a health care provider for individual recommendations. If you have any sort of medical condition, be sure to consult your health care provider before starting a new exercise program. What are some activities that can help me to lose weight?  Walking at a rate of at least 4.5 miles an hour.  Jogging or running at a rate of 5 miles per hour.  Biking at a rate of at least 10 miles per hour.  Lap swimming.  Roller-skating or in-line skating.  Cross-country skiing.  Vigorous competitive sports, such as football, basketball, and soccer.  Jumping rope.  Aerobic dancing. How can I be more active in my day-to-day  activities?  Use the stairs instead of the elevator.  Take a walk during your lunch break.  If you drive, park your car farther away from work or school.  If you take public transportation, get off one stop early and walk the rest of the way.  Make all of your phone calls while standing up and walking around.  Get up, stretch, and walk around every 30 minutes throughout the day. What guidelines should I follow while exercising?  Do not exercise so much that you hurt yourself, feel dizzy, or get very short of breath.  Consult your health care provider prior to starting a new exercise program.  Wear comfortable clothes and shoes with good support.  Drink plenty of water while you exercise to prevent dehydration or heat stroke. Body water is lost during exercise and must be replaced.  Work out until you breathe faster and your heart beats faster. This information is not intended to replace advice given to you by your health care provider. Make sure you discuss any questions you have with your health care provider. Document Released: 12/20/2010 Document Revised: 04/24/2016 Document Reviewed: 04/20/2014 Elsevier Interactive Patient Education  2018 [employer]  

## 2018-11-11 NOTE — Addendum Note (Signed)
Addended by: Varney BilesWIESNER, Datrell Dunton M on: 11/11/2018 11:02 AM   Modules accepted: Orders

## 2018-11-16 LAB — HEPATITIS C RNA QUANTITATIVE
HCV Quantitative Log: <1.18 Log IU/mL
HCV RNA, PCR, QN: NOT DETECTED [IU]/mL

## 2018-11-16 LAB — IRON,TIBC AND FERRITIN PANEL
%SAT: 35 % (calc) (ref 20–48)
Ferritin: 208 ng/mL (ref 24–380)
IRON: 119 ug/dL (ref 50–180)
TIBC: 336 mcg/dL (calc) (ref 250–425)

## 2018-11-18 ENCOUNTER — Other Ambulatory Visit: Payer: Self-pay | Admitting: Family Medicine

## 2018-11-18 DIAGNOSIS — K219 Gastro-esophageal reflux disease without esophagitis: Secondary | ICD-10-CM

## 2018-12-06 ENCOUNTER — Other Ambulatory Visit: Payer: Self-pay | Admitting: Family Medicine

## 2018-12-06 DIAGNOSIS — M722 Plantar fascial fibromatosis: Secondary | ICD-10-CM

## 2019-02-03 ENCOUNTER — Other Ambulatory Visit: Payer: Self-pay | Admitting: Family Medicine

## 2019-02-03 DIAGNOSIS — M722 Plantar fascial fibromatosis: Secondary | ICD-10-CM

## 2019-03-28 ENCOUNTER — Telehealth: Payer: Self-pay | Admitting: Family Medicine

## 2019-03-28 NOTE — Telephone Encounter (Signed)
I called and left message on patient voicemail to call office and schedule AWV with Angle.

## 2019-04-14 ENCOUNTER — Other Ambulatory Visit: Payer: Self-pay | Admitting: Family Medicine

## 2019-04-14 DIAGNOSIS — I10 Essential (primary) hypertension: Secondary | ICD-10-CM

## 2019-04-14 NOTE — Telephone Encounter (Signed)
Asked patient to return for appointment.

## 2019-06-02 ENCOUNTER — Other Ambulatory Visit: Payer: Self-pay | Admitting: Family Medicine

## 2019-06-02 DIAGNOSIS — M109 Gout, unspecified: Secondary | ICD-10-CM

## 2019-06-02 DIAGNOSIS — K219 Gastro-esophageal reflux disease without esophagitis: Secondary | ICD-10-CM

## 2019-06-18 ENCOUNTER — Other Ambulatory Visit: Payer: Self-pay | Admitting: Family Medicine

## 2019-06-18 DIAGNOSIS — I10 Essential (primary) hypertension: Secondary | ICD-10-CM

## 2019-07-06 ENCOUNTER — Other Ambulatory Visit: Payer: Self-pay | Admitting: Family Medicine

## 2019-07-06 DIAGNOSIS — I10 Essential (primary) hypertension: Secondary | ICD-10-CM

## 2019-07-17 ENCOUNTER — Other Ambulatory Visit: Payer: Self-pay | Admitting: Family Medicine

## 2019-07-17 DIAGNOSIS — M722 Plantar fascial fibromatosis: Secondary | ICD-10-CM

## 2019-07-29 ENCOUNTER — Other Ambulatory Visit: Payer: Self-pay | Admitting: Family Medicine

## 2019-07-29 DIAGNOSIS — I10 Essential (primary) hypertension: Secondary | ICD-10-CM

## 2019-08-15 ENCOUNTER — Other Ambulatory Visit: Payer: Self-pay | Admitting: Family Medicine

## 2019-08-15 DIAGNOSIS — I10 Essential (primary) hypertension: Secondary | ICD-10-CM

## 2019-08-31 ENCOUNTER — Other Ambulatory Visit: Payer: Self-pay | Admitting: Family Medicine

## 2019-08-31 DIAGNOSIS — I10 Essential (primary) hypertension: Secondary | ICD-10-CM

## 2019-08-31 DIAGNOSIS — M722 Plantar fascial fibromatosis: Secondary | ICD-10-CM

## 2019-08-31 DIAGNOSIS — E782 Mixed hyperlipidemia: Secondary | ICD-10-CM

## 2019-09-06 ENCOUNTER — Other Ambulatory Visit: Payer: Self-pay | Admitting: Family Medicine

## 2019-09-06 DIAGNOSIS — I10 Essential (primary) hypertension: Secondary | ICD-10-CM

## 2019-09-13 ENCOUNTER — Other Ambulatory Visit: Payer: Self-pay | Admitting: Family Medicine

## 2019-09-13 DIAGNOSIS — K219 Gastro-esophageal reflux disease without esophagitis: Secondary | ICD-10-CM

## 2019-10-05 ENCOUNTER — Other Ambulatory Visit: Payer: Self-pay | Admitting: Family Medicine

## 2019-10-05 DIAGNOSIS — I10 Essential (primary) hypertension: Secondary | ICD-10-CM

## 2019-11-01 ENCOUNTER — Other Ambulatory Visit: Payer: Self-pay | Admitting: Family Medicine

## 2019-11-01 DIAGNOSIS — I10 Essential (primary) hypertension: Secondary | ICD-10-CM

## 2019-11-08 DIAGNOSIS — Z719 Counseling, unspecified: Secondary | ICD-10-CM | POA: Diagnosis not present

## 2019-11-08 NOTE — Progress Notes (Signed)
Virtual Visit via Video Note  I connected with patient on 11/09/19 at  2:30 PM EST by audio enabled telemedicine application and verified that I am speaking with the correct person using two identifiers.   THIS ENCOUNTER IS A VIRTUAL VISIT DUE TO COVID-19 - PATIENT WAS NOT SEEN IN THE OFFICE. PATIENT HAS CONSENTED TO VIRTUAL VISIT / TELEMEDICINE VISIT   Location of patient: home  Location of provider: office  I discussed the limitations of evaluation and management by telemedicine and the availability of in person appointments. The patient expressed understanding and agreed to proceed.   Subjective:   HARSHAAN Burke is a 68 y.o. male who presents for an Initial Medicare Annual Wellness Visit.  The Patient was informed that the wellness visit is to identify future health risk and educate and initiate measures that can reduce risk for increased disease through the lifespan.   Describes health as fair, good or great? Great!  Retired.   Review of Systems    Home Safety/Smoke Alarms: Feels safe in home. Smoke alarms in place.  Lives alone in 2nd floor apt. Does well w/ stairs.  Male:   CCS-pt reports last done 3-4 yrs ago at [facility] location. Will request report.  PSA-  Lab Results  Component Value Date   PSA 1.21 11/11/2018   PSA 0.79 11/20/2017      Objective:     Advanced Directives 11/09/2019 12/03/2016  Does Patient Have a Medical Advance Directive? No No  Would patient like information on creating a medical advance directive? No - Patient declined No - Patient declined    Current Medications (verified) Outpatient Encounter Medications as of 11/09/2019  Medication Sig  . allopurinol (ZYLOPRIM) 100 MG tablet TAKE 1 TABLET BY MOUTH TWICE A DAY  . amLODipine (NORVASC) 10 MG tablet TAKE 1 TABLET BY MOUTH EVERY DAY  . aspirin 81 MG chewable tablet Chew 81 mg by mouth daily.   [name] atorvastatin (LIPITOR) 20 MG tablet TAKE 1 TABLET BY MOUTH EVERY DAY  . colchicine 0.6 MG  tablet Take 1 tablet (0.6 mg total) by mouth 2 (two) times daily. As needed for attacks.  . ferrous sulfate 325 (65 FE) MG tablet Take 1 tablet (325 mg total) by mouth daily with breakfast.  . fluticasone (FLONASE) 50 MCG/ACT nasal spray Place 2 sprays into both nostrils daily.  [name] levothyroxine (SYNTHROID, LEVOTHROID) 75 MCG tablet Take 1 tablet (75 mcg total) by mouth daily before breakfast.  . lisinopril (ZESTRIL) 40 MG tablet TAKE 1 BY MOUTH DAILY (MUST SCHED OV FOR FUTURE FILLS)  . meloxicam (MOBIC) 15 MG tablet TAKE 1 TABLET BY MOUTH DAILY. FOR 14 DAYS AND THEN AS NEEDED.  [name] omeprazole (PRILOSEC) 40 MG capsule TAKE 1 CAPSULE BY MOUTH EVERY DAY   No facility-administered encounter medications on file as of 11/09/2019.     Allergies (verified) Patient has no known allergies.   History: Past Medical History:  Diagnosis Date  . Gout   . Hypertension   . Thyroid disease    History reviewed. No pertinent surgical history. History reviewed. No pertinent family history. Social History   Socioeconomic History  . Marital status: Single    Spouse name: Not on file  . Number of children: Not on file  . Years of education: Not on file  . Highest education level: Not on file  Occupational History  . Not on file  Social Needs  . Financial resource strain: Not on file  . Food insecurity  Worry: Not on file    Inability: Not on file  . Transportation needs    Medical: Not on file    Non-medical: Not on file  Tobacco Use  . Smoking status: Former Games developermoker  . Smokeless tobacco: Never Used  Substance and Sexual Activity  . Alcohol use: Yes    Comment: occassionally on the weekinds.  . Drug use: Not on file  . Sexual activity: Not on file  Lifestyle  . Physical activity    Days per week: Not on file    Minutes per session: Not on file  . Stress: Not on file  Relationships  . Social Musicianconnections    Talks on phone: Not on file    Gets together: Not on file    Attends religious  service: Not on file    Active member of club or organization: Not on file    Attends meetings of clubs or organizations: Not on file    Relationship status: Not on file  Other Topics Concern  . Not on file  Social History Narrative  . Not on file   Tobacco Counseling Counseling given: Not Answered   Clinical Intake: Pain : No/denies pain    Activities of Daily Living In your present state of health, do you have any difficulty performing the following activities: 11/09/2019  Hearing? N  Vision? N  Difficulty concentrating or making decisions? N  Walking or climbing stairs? N  Dressing or bathing? N  Doing errands, shopping? N  Preparing Food and eating ? N  Using the Toilet? N  In the past six months, have you accidently leaked urine? N  Do you have problems with loss of bowel control? N  Managing your Medications? N  Managing your Finances? N  Some recent data might be hidden     Immunizations and Health Maintenance Immunization History  Administered Date(s) Administered  . Influenza, High Dose Seasonal PF 11/20/2017  . Influenza,inj,Quad PF,6+ Mos 10/02/2015, 11/05/2016  . Pneumococcal Conjugate-13 11/20/2017  . Pneumococcal Polysaccharide-23 10/02/2015   Health Maintenance Due  Topic Date Due  . TETANUS/TDAP  03/02/1969  . INFLUENZA VACCINE  07/02/2019    Patient Care Team: Mliss SaxKremer, William Alfred, MD as PCP - General (Family Medicine)  Indicate any recent Medical Services you may have received from other than [facility] providers in the past year (date may be approximate).    Assessment:   This is a routine wellness examination for Paul Burke. Physical assessment deferred to PCP.  Hearing/Vision screen Unable to assess. This visit is enabled though telemedicine due to Covid 19.   Dietary issues and exercise activities discussed: Current Exercise Habits: The patient does not participate in regular exercise at present, Exercise limited by: None identified Diet (meal  preparation, eat out, water intake, caffeinated beverages, dairy products, fruits and vegetables): 24 hr recall Breakfast: bacon and eggs,toast and juice Lunch: skips. Late breakfast. Dinner:    pizza  Goals    . Increase physical activity      Depression Screen PHQ 2/9 Scores 11/09/2019 12/19/2016  PHQ - 2 Score 0 0  Exception Documentation - Other- indicate reason in comment box    Fall Risk Fall Risk  11/09/2019 12/19/2016  Falls in the past year? 0 No  Risk for fall due to : - Other (Comment)   Cognitive Function: Ad8 score reviewed for issues:  Issues making decisions:no  Less interest in hobbies / activities:no  Repeats questions, stories (family complaining):no  Trouble using ordinary gadgets (microwave, computer,  phone):no  Forgets the month or year: no  Mismanaging finances: no  Remembering appts:no  Daily problems with thinking and/or memory:no Ad8 score is=0          Screening Tests Health Maintenance  Topic Date Due  . TETANUS/TDAP  03/02/1969  . INFLUENZA VACCINE  07/02/2019  . COLONOSCOPY  08/30/2024  . Hepatitis C Screening  Completed  . PNA vac Low Risk Adult  Completed       Plan:    Please schedule your next medicare wellness visit with me in 1 yr.  Continue to eat heart healthy diet (full of fruits, vegetables, whole grains, lean protein, water--limit salt, fat, and sugar intake) and increase physical activity as tolerated.  Continue doing brain stimulating activities (puzzles, reading, adult coloring books, staying active) to keep memory sharp.   Bring a copy of your living will and/or healthcare power of attorney to your next office visit.   I have personally reviewed and noted the following in the patient's chart:   . Medical and social history . Use of alcohol, tobacco or illicit drugs  . Current medications and supplements . Functional ability and status . Nutritional status . Physical activity . Advanced directives . List  of other physicians . Hospitalizations, surgeries, and ER visits in previous 12 months . Vitals . Screenings to include cognitive, depression, and falls . Referrals and appointments  In addition, I have reviewed and discussed with patient certain preventive protocols, quality metrics, and best practice recommendations. A written personalized care plan for preventive services as well as general preventive health recommendations were provided to patient.     Avon Gully, [place]   11/09/2019

## 2019-11-09 ENCOUNTER — Ambulatory Visit (INDEPENDENT_AMBULATORY_CARE_PROVIDER_SITE_OTHER): Payer: Medicare Other | Admitting: *Deleted

## 2019-11-09 ENCOUNTER — Encounter: Payer: Self-pay | Admitting: *Deleted

## 2019-11-09 DIAGNOSIS — Z0000 Encounter for general adult medical examination without abnormal findings: Secondary | ICD-10-CM

## 2019-11-09 NOTE — Patient Instructions (Signed)
Please schedule your next medicare wellness visit with me in 1 yr.  Continue to eat heart healthy diet (full of fruits, vegetables, whole grains, lean protein, water--limit salt, fat, and sugar intake) and increase physical activity as tolerated.  Continue doing brain stimulating activities (puzzles, reading, adult coloring books, staying active) to keep memory sharp.   Bring a copy of your living will and/or healthcare power of attorney to your next office visit.   Paul Burke , Thank you for taking time to come for your Medicare Wellness Visit. I appreciate your ongoing commitment to your health goals. Please review the following plan we discussed and let me know if I can assist you in the future.   These are the goals we discussed: Goals    . Increase physical activity       This is a list of the screening recommended for you and due dates:  Health Maintenance  Topic Date Due  . Tetanus Vaccine  03/02/1969  . Flu Shot  07/02/2019  . Colon Cancer Screening  08/30/2024  .  Hepatitis C: One time screening is recommended by Center for Disease Control  (CDC) for  adults born from [birth year] through 1965.   Completed  . Pneumonia vaccines  Completed    Preventive Care 102 Years and Older, Male Preventive care refers to lifestyle choices and visits with your health care provider that can promote health and wellness. This includes:  A yearly physical exam. This is also called an annual well check.  Regular dental and eye exams.  Immunizations.  Screening for certain conditions.  Healthy lifestyle choices, such as diet and exercise. What can I expect for my preventive care visit? Physical exam Your health care provider will check:  Height and weight. These may be used to calculate body mass index (BMI), which is a measurement that tells if you are at a healthy weight.  Heart rate and blood pressure.  Your skin for abnormal spots. Counseling Your health care provider may ask you  questions about:  Alcohol, tobacco, and drug use.  Emotional well-being.  Home and relationship well-being.  Sexual activity.  Eating habits.  History of falls.  Memory and ability to understand (cognition).  Work and work Statistician. What immunizations do I need?  Influenza (flu) vaccine  This is recommended every year. Tetanus, diphtheria, and pertussis (Tdap) vaccine  You may need a Td booster every 10 years. Varicella (chickenpox) vaccine  You may need this vaccine if you have not already been vaccinated. Zoster (shingles) vaccine  You may need this after age 17. Pneumococcal conjugate (PCV13) vaccine  One dose is recommended after age 29. Pneumococcal polysaccharide (PPSV23) vaccine  One dose is recommended after age 92. Measles, mumps, and rubella (MMR) vaccine  You may need at least one dose of MMR if you were born in 1957 or later. You may also need a second dose. Meningococcal conjugate (MenACWY) vaccine  You may need this if you have certain conditions. Hepatitis A vaccine  You may need this if you have certain conditions or if you travel or work in places where you may be exposed to hepatitis A. Hepatitis B vaccine  You may need this if you have certain conditions or if you travel or work in places where you may be exposed to hepatitis B. Haemophilus influenzae type b (Hib) vaccine  You may need this if you have certain conditions. You may receive vaccines as individual doses or as more than one vaccine together in  one shot (combination vaccines). Talk with your health care provider about the risks and benefits of combination vaccines. What tests do I need? Blood tests  Lipid and cholesterol levels. These may be checked every 5 years, or more frequently depending on your overall health.  Hepatitis C test.  Hepatitis B test. Screening  Lung cancer screening. You may have this screening every year starting at age 79 if you have a 30-pack-year  history of smoking and currently smoke or have quit within the past 15 years.  Colorectal cancer screening. All adults should have this screening starting at age 72 and continuing until age 37. Your health care provider may recommend screening at age 34 if you are at increased risk. You will have tests every 1-10 years, depending on your results and the type of screening test.  Prostate cancer screening. Recommendations will vary depending on your family history and other risks.  Diabetes screening. This is done by checking your blood sugar (glucose) after you have not eaten for a while (fasting). You may have this done every 1-3 years.  Abdominal aortic aneurysm (AAA) screening. You may need this if you are a current or former smoker.  Sexually transmitted disease (STD) testing. Follow these instructions at home: Eating and drinking  Eat a diet that includes fresh fruits and vegetables, whole grains, lean protein, and low-fat dairy products. Limit your intake of foods with high amounts of sugar, saturated fats, and salt.  Take vitamin and mineral supplements as recommended by your health care provider.  Do not drink alcohol if your health care provider tells you not to drink.  If you drink alcohol: ? Limit how much you have to 0-2 drinks a day. ? Be aware of how much alcohol is in your drink. In the [place] one drink equals one 12 oz bottle of beer (355 mL), one 5 oz glass of wine (148 mL), or one 1 oz glass of hard liquor (44 mL). Lifestyle  Take daily care of your teeth and gums.  Stay active. Exercise for at least 30 minutes on 5 or more days each week.  Do not use any products that contain nicotine or tobacco, such as cigarettes, e-cigarettes, and chewing tobacco. If you need help quitting, ask your health care provider.  If you are sexually active, practice safe sex. Use a condom or other form of protection to prevent STIs (sexually transmitted infections).  Talk with your  health care provider about taking a low-dose aspirin or statin. What's next?  Visit your health care provider once a year for a well check visit.  Ask your health care provider how often you should have your eyes and teeth checked.  Stay up to date on all vaccines. This information is not intended to replace advice given to you by your health care provider. Make sure you discuss any questions you have with your health care provider. Document Released: 12/14/2015 Document Revised: 11/11/2018 Document Reviewed: 11/11/2018 Elsevier Patient Education  2020 [employer].

## 2019-11-17 ENCOUNTER — Other Ambulatory Visit: Payer: Self-pay | Admitting: Family Medicine

## 2019-11-17 DIAGNOSIS — M722 Plantar fascial fibromatosis: Secondary | ICD-10-CM

## 2019-11-17 NOTE — Telephone Encounter (Signed)
Attempted to reach pt, no answer. Left vm to call back  

## 2019-11-19 ENCOUNTER — Other Ambulatory Visit: Payer: Self-pay | Admitting: Family Medicine

## 2019-11-19 DIAGNOSIS — E079 Disorder of thyroid, unspecified: Secondary | ICD-10-CM

## 2019-11-19 DIAGNOSIS — I10 Essential (primary) hypertension: Secondary | ICD-10-CM

## 2019-11-21 NOTE — Telephone Encounter (Signed)
Attempted to reach patient. No answer. Vm left to call back  

## 2019-11-28 ENCOUNTER — Other Ambulatory Visit: Payer: Self-pay

## 2019-11-28 NOTE — Telephone Encounter (Signed)
Attempted to reach pt for a 3rd time. No answer. Left a vm to call back. Letter sent to pt

## 2019-12-06 ENCOUNTER — Other Ambulatory Visit: Payer: Self-pay | Admitting: Family Medicine

## 2019-12-06 DIAGNOSIS — I10 Essential (primary) hypertension: Secondary | ICD-10-CM

## 2019-12-06 DIAGNOSIS — M722 Plantar fascial fibromatosis: Secondary | ICD-10-CM

## 2019-12-16 ENCOUNTER — Other Ambulatory Visit: Payer: Self-pay | Admitting: Family Medicine

## 2019-12-16 DIAGNOSIS — E079 Disorder of thyroid, unspecified: Secondary | ICD-10-CM

## 2019-12-26 ENCOUNTER — Other Ambulatory Visit: Payer: Self-pay | Admitting: Family Medicine

## 2019-12-26 DIAGNOSIS — M722 Plantar fascial fibromatosis: Secondary | ICD-10-CM

## 2019-12-31 ENCOUNTER — Other Ambulatory Visit: Payer: Self-pay | Admitting: Family Medicine

## 2019-12-31 DIAGNOSIS — I10 Essential (primary) hypertension: Secondary | ICD-10-CM

## 2020-01-02 NOTE — Telephone Encounter (Signed)
Spoke with pt tried to schedule an appointment to come in for follow up. Pt states that he will call back to schedule an appointment "in the near future".

## 2020-01-03 ENCOUNTER — Other Ambulatory Visit: Payer: Self-pay | Admitting: Family Medicine

## 2020-01-03 DIAGNOSIS — M722 Plantar fascial fibromatosis: Secondary | ICD-10-CM

## 2020-01-03 DIAGNOSIS — I10 Essential (primary) hypertension: Secondary | ICD-10-CM

## 2020-01-03 DIAGNOSIS — E079 Disorder of thyroid, unspecified: Secondary | ICD-10-CM

## 2020-01-06 ENCOUNTER — Other Ambulatory Visit: Payer: Self-pay | Admitting: Family Medicine

## 2020-01-06 DIAGNOSIS — I10 Essential (primary) hypertension: Secondary | ICD-10-CM

## 2020-01-11 ENCOUNTER — Other Ambulatory Visit: Payer: Self-pay

## 2020-01-12 ENCOUNTER — Ambulatory Visit (INDEPENDENT_AMBULATORY_CARE_PROVIDER_SITE_OTHER): Payer: Medicare Other | Admitting: Family Medicine

## 2020-01-12 ENCOUNTER — Encounter: Payer: Self-pay | Admitting: Family Medicine

## 2020-01-12 VITALS — BP 128/72 | HR 120 | Temp 96.9°F | Ht 71.0 in | Wt 220.2 lb

## 2020-01-12 DIAGNOSIS — E782 Mixed hyperlipidemia: Secondary | ICD-10-CM | POA: Diagnosis not present

## 2020-01-12 DIAGNOSIS — E079 Disorder of thyroid, unspecified: Secondary | ICD-10-CM | POA: Diagnosis not present

## 2020-01-12 DIAGNOSIS — Z8739 Personal history of other diseases of the musculoskeletal system and connective tissue: Secondary | ICD-10-CM | POA: Insufficient documentation

## 2020-01-12 DIAGNOSIS — Z23 Encounter for immunization: Secondary | ICD-10-CM | POA: Diagnosis not present

## 2020-01-12 DIAGNOSIS — Z8619 Personal history of other infectious and parasitic diseases: Secondary | ICD-10-CM | POA: Diagnosis not present

## 2020-01-12 DIAGNOSIS — I10 Essential (primary) hypertension: Secondary | ICD-10-CM

## 2020-01-12 DIAGNOSIS — R7309 Other abnormal glucose: Secondary | ICD-10-CM

## 2020-01-12 DIAGNOSIS — Z0000 Encounter for general adult medical examination without abnormal findings: Secondary | ICD-10-CM

## 2020-01-12 DIAGNOSIS — E611 Iron deficiency: Secondary | ICD-10-CM | POA: Insufficient documentation

## 2020-01-12 NOTE — Progress Notes (Signed)
Established Patient Office Visit  Subjective:  Patient ID: Paul Burke, male    DOB: December 22, 1950  Age: 69 y.o. MRN: [id number]  CC:  Chief Complaint  Patient presents with  . Annual Exam    CPE, no concerns at this time.     HPI Paul Burke presents for follow-up of his hypertension, hyperlipidemia, gout and hypothyroidism.  He has been lost to follow-up for over a year now.  He had been afraid to come in because of Covid.  Nonfasting today.  Was unable to access dental care over this past year.  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  Continues to take all medicines as directed.  He had been taking Mobic daily.  Past Medical History:  Diagnosis Date  . Gout   . Hypertension   . Thyroid disease     History reviewed. No pertinent surgical history.  History reviewed. No pertinent family history.  Social History   Socioeconomic History  . Marital status: Single    Spouse name: Not on file  . Number of children: Not on file  . Years of education: Not on file  . Highest education level: Not on file  Occupational History  . Not on file  Tobacco Use  . Smoking status: Former Games developer  . Smokeless tobacco: Never Used  Substance and Sexual Activity  . Alcohol use: Yes    Comment: occassionally on the weekinds.  . Drug use: Not on file  . Sexual activity: Not on file  Other Topics Concern  . Not on file  Social History Narrative  . Not on file   Social Determinants of Health   Financial Resource Strain:   . Difficulty of Paying Living Expenses: Not on file  Food Insecurity:   . Worried About Programme researcher, broadcasting/film/video in the Last Year: Not on file  . Ran Out of Food in the Last Year: Not on file  Transportation Needs:   . Lack of Transportation (Medical): Not on file  . Lack of Transportation (Non-Medical): Not on file  Physical Activity:   . Days of Exercise per Week: Not on file  . Minutes of Exercise per Session: Not on file  Stress:   . Feeling of Stress  : Not on file  Social Connections:   . Frequency of Communication with Friends and Family: Not on file  . Frequency of Social Gatherings with Friends and Family: Not on file  . Attends Religious Services: Not on file  . Active Member of Clubs or Organizations: Not on file  . Attends Banker Meetings: Not on file  . Marital Status: Not on file  Intimate Partner Violence:   . Fear of Current or Ex-Partner: Not on file  . Emotionally Abused: Not on file  . Physically Abused: Not on file  . Sexually Abused: Not on file    Outpatient Medications Prior to Visit  Medication Sig Dispense Refill  . allopurinol (ZYLOPRIM) 100 MG tablet TAKE 1 TABLET BY MOUTH TWICE A DAY 180 tablet 1  . amLODipine (NORVASC) 10 MG tablet TAKE 1 TABLET BY MOUTH EVERY DAY 30 tablet 0  . aspirin 81 MG chewable tablet Chew 81 mg by mouth daily.     [name] atorvastatin (LIPITOR) 20 MG tablet TAKE 1 TABLET BY MOUTH EVERY DAY 90 tablet 3  . colchicine 0.6 MG tablet Take 1 tablet (0.6 mg total) by mouth 2 (two) times daily. As needed for attacks. 60 tablet 3  .  fluticasone (FLONASE) 50 MCG/ACT nasal spray Place 2 sprays into both nostrils daily. 16 g 6  . levothyroxine (SYNTHROID) 75 MCG tablet TAKE 1 TABLET (75 MCG TOTAL) BY MOUTH DAILY BEFORE BREAKFAST. 30 tablet 0  . lisinopril (ZESTRIL) 40 MG tablet TAKE 1 BY MOUTH DAILY (MUST SCHED OV FOR FUTURE FILLS) 30 tablet 0  . meloxicam (MOBIC) 15 MG tablet TAKE 1 TABLET BY MOUTH DAILY. FOR 14 DAYS AND THEN AS NEEDED. 30 tablet 0  . omeprazole (PRILOSEC) 40 MG capsule TAKE 1 CAPSULE BY MOUTH EVERY DAY 90 capsule 1  . ferrous sulfate 325 (65 FE) MG tablet Take 1 tablet (325 mg total) by mouth daily with breakfast. 90 tablet 1   No facility-administered medications prior to visit.    No Known Allergies  ROS Review of Systems  Constitutional: Negative.   HENT: Negative.   Eyes: Negative for photophobia and visual disturbance.  Respiratory: Negative.     Cardiovascular: Negative.   Gastrointestinal: Negative.   Endocrine: Negative for polyphagia and polyuria.  Genitourinary: Negative.   Musculoskeletal: Negative for gait problem and joint swelling.  Allergic/Immunologic: Negative for immunocompromised state.  Neurological: Negative for tremors and speech difficulty.  Hematological: Does not bruise/bleed easily.  Psychiatric/Behavioral: Negative.       Objective:    Physical Exam  Constitutional: He is oriented to person, place, and time. He appears well-developed and well-nourished. No distress.  HENT:  Head: Normocephalic and atraumatic.  Right Ear: External ear normal.  Left Ear: External ear normal.  Mouth/Throat: Oropharynx is clear and moist. No oropharyngeal exudate.  Eyes: Pupils are equal, round, and reactive to light. Conjunctivae are normal. Right eye exhibits no discharge. Left eye exhibits no discharge. No scleral icterus.  Neck: No JVD present. No tracheal deviation present.  Cardiovascular: Normal rate, regular rhythm and normal heart sounds.  Pulmonary/Chest: Effort normal and breath sounds normal. No stridor.  Musculoskeletal:        General: No edema.  Neurological: He is alert and oriented to person, place, and time.  Skin: Skin is warm and dry. He is not diaphoretic.  Psychiatric: He has a normal mood and affect. His behavior is normal.    BP 128/72   Pulse (!) 120   Temp (!) 96.9 F (36.1 C) (Tympanic)   Ht 5\' 11"  (1.803 m)   Wt 220 lb 3.2 oz (99.9 kg)   SpO2 96%   BMI 30.71 kg/m  Wt Readings from Last 3 Encounters:  01/12/20 220 lb 3.2 oz (99.9 kg)  11/11/18 227 lb 2 oz (103 kg)  10/05/18 226 lb 6 oz (102.7 kg)     There are no preventive care reminders to display for this patient.  There are no preventive care reminders to display for this patient.  Lab Results  Component Value Date   TSH 2.06 11/11/2018   Lab Results  Component Value Date   WBC 6.0 11/11/2018   HGB 13.7 11/11/2018    HCT 40.1 11/11/2018   MCV 95.6 11/11/2018   PLT 185.0 11/11/2018   Lab Results  Component Value Date   NA 139 11/11/2018   K 3.6 11/11/2018   CO2 23 11/11/2018   GLUCOSE 155 (H) 11/11/2018   BUN 25 (H) 11/11/2018   CREATININE 1.53 (H) 11/11/2018   BILITOT 0.8 11/11/2018   ALKPHOS 60 11/11/2018   AST 31 11/11/2018   ALT 33 11/11/2018   PROT 8.3 11/11/2018   ALBUMIN 4.6 11/11/2018   CALCIUM 9.6 11/11/2018  GFR 58.38 (L) 11/11/2018   Lab Results  Component Value Date   CHOL 179 11/11/2018   Lab Results  Component Value Date   HDL 58.60 11/11/2018   Lab Results  Component Value Date   LDLCALC 99 11/11/2018   Lab Results  Component Value Date   TRIG 108.0 11/11/2018   Lab Results  Component Value Date   CHOLHDL 3 11/11/2018   Lab Results  Component Value Date   HGBA1C 6.2 03/11/2018      Assessment & Plan:   Problem List Items Addressed This Visit      Cardiovascular and Mediastinum   Essential hypertension   Relevant Orders   CBC   Comprehensive metabolic panel   Urinalysis, Routine w reflex microscopic     Digestive   History of hepatitis C   Relevant Orders   Comprehensive metabolic panel     Endocrine   Thyroid disease   Relevant Orders   TSH     Other   Mixed hyperlipidemia   Relevant Orders   Comprehensive metabolic panel   LDL cholesterol, direct   Lipid panel   Healthcare maintenance   Relevant Orders   PSA   Need for Tdap vaccination   Relevant Orders   Tdap vaccine greater than or equal to 7yo IM (Completed)   Need for influenza vaccination - Primary   Relevant Orders   Flu Vaccine QUAD High Dose(Fluad) (Completed)   Elevated glucose   Relevant Orders   Comprehensive metabolic panel   Hemoglobin A1c   History of gout   Relevant Orders   Comprehensive metabolic panel   Uric acid   Iron deficiency   Relevant Orders   Iron, TIBC and Ferritin Panel      No orders of the defined types were placed in this  encounter.   Follow-up: Return in about 3 months (around 04/10/2020), or return fasting for blood work..  Stressed the importance of regular follow-up.  Advised him to use Mobic only on an as-needed basis.  The heart doctors do not like it.  Libby Maw, MD

## 2020-01-16 ENCOUNTER — Other Ambulatory Visit: Payer: Self-pay

## 2020-01-17 ENCOUNTER — Other Ambulatory Visit (INDEPENDENT_AMBULATORY_CARE_PROVIDER_SITE_OTHER): Payer: Medicare Other

## 2020-01-17 DIAGNOSIS — E611 Iron deficiency: Secondary | ICD-10-CM | POA: Diagnosis not present

## 2020-01-17 DIAGNOSIS — Z0000 Encounter for general adult medical examination without abnormal findings: Secondary | ICD-10-CM

## 2020-01-17 DIAGNOSIS — Z8739 Personal history of other diseases of the musculoskeletal system and connective tissue: Secondary | ICD-10-CM | POA: Diagnosis not present

## 2020-01-17 DIAGNOSIS — Z8619 Personal history of other infectious and parasitic diseases: Secondary | ICD-10-CM | POA: Diagnosis not present

## 2020-01-17 DIAGNOSIS — E079 Disorder of thyroid, unspecified: Secondary | ICD-10-CM

## 2020-01-17 DIAGNOSIS — I10 Essential (primary) hypertension: Secondary | ICD-10-CM

## 2020-01-17 DIAGNOSIS — R7309 Other abnormal glucose: Secondary | ICD-10-CM

## 2020-01-17 DIAGNOSIS — E782 Mixed hyperlipidemia: Secondary | ICD-10-CM | POA: Diagnosis not present

## 2020-01-17 LAB — CBC
HCT: 36.5 % — ABNORMAL LOW (ref 39.0–52.0)
Hemoglobin: 12.2 g/dL — ABNORMAL LOW (ref 13.0–17.0)
MCHC: 33.3 g/dL (ref 30.0–36.0)
MCV: 94.6 fl (ref 78.0–100.0)
Platelets: 192 10*3/uL (ref 150.0–400.0)
RBC: 3.86 Mil/uL — ABNORMAL LOW (ref 4.22–5.81)
RDW: 14.7 % (ref 11.5–15.5)
WBC: 6.4 10*3/uL (ref 4.0–10.5)

## 2020-01-17 LAB — LIPID PANEL
Cholesterol: 134 mg/dL (ref 0–200)
HDL: 65.5 mg/dL (ref >39.00)
LDL Cholesterol: 44 mg/dL (ref 0–99)
NonHDL: 68.88
Total CHOL/HDL Ratio: 2
Triglycerides: 123 mg/dL (ref 0.0–149.0)
VLDL: 24.6 mg/dL (ref 0.0–40.0)

## 2020-01-17 LAB — HEMOGLOBIN A1C: Hgb A1c MFr Bld: 6.7 % — ABNORMAL HIGH (ref 4.6–6.5)

## 2020-01-17 LAB — URIC ACID: Uric Acid, Serum: 3.9 mg/dL — ABNORMAL LOW (ref 4.0–7.8)

## 2020-01-17 LAB — COMPREHENSIVE METABOLIC PANEL
ALT: 28 U/L (ref 0–53)
AST: 27 U/L (ref 0–37)
Albumin: 4.2 g/dL (ref 3.5–5.2)
Alkaline Phosphatase: 78 U/L (ref 39–117)
BUN: 21 mg/dL (ref 6–23)
CO2: 21 mEq/L (ref 19–32)
Calcium: 9.2 mg/dL (ref 8.4–10.5)
Chloride: 104 mEq/L (ref 96–112)
Creatinine, Ser: 1.39 mg/dL (ref 0.40–1.50)
GFR: 61.15 mL/min (ref >60.00)
Glucose, Bld: 194 mg/dL — ABNORMAL HIGH (ref 70–99)
Potassium: 3.7 mEq/L (ref 3.5–5.1)
Sodium: 136 mEq/L (ref 135–145)
Total Bilirubin: 1 mg/dL (ref 0.2–1.2)
Total Protein: 7.8 g/dL (ref 6.0–8.3)

## 2020-01-17 LAB — PSA: PSA: 1.07 ng/mL (ref 0.10–4.00)

## 2020-01-17 LAB — LDL CHOLESTEROL, DIRECT: Direct LDL: 48 mg/dL

## 2020-01-17 LAB — TSH: TSH: 0.44 u[IU]/mL (ref 0.35–4.50)

## 2020-01-17 NOTE — Progress Notes (Signed)
Patient was unable to leave urine specimen so he has scheduled a lab visit to have that done another time/thx dmf

## 2020-01-18 LAB — IRON,TIBC AND FERRITIN PANEL
%SAT: 45 % (calc) (ref 20–48)
Ferritin: 187 ng/mL (ref 24–380)
Iron: 144 ug/dL (ref 50–180)
TIBC: 319 mcg/dL (calc) (ref 250–425)

## 2020-01-28 ENCOUNTER — Other Ambulatory Visit: Payer: Self-pay | Admitting: Family Medicine

## 2020-01-28 DIAGNOSIS — I10 Essential (primary) hypertension: Secondary | ICD-10-CM

## 2020-02-01 ENCOUNTER — Other Ambulatory Visit: Payer: Self-pay | Admitting: Family Medicine

## 2020-02-01 DIAGNOSIS — I10 Essential (primary) hypertension: Secondary | ICD-10-CM

## 2020-03-01 ENCOUNTER — Other Ambulatory Visit: Payer: Self-pay | Admitting: Family Medicine

## 2020-03-01 DIAGNOSIS — E079 Disorder of thyroid, unspecified: Secondary | ICD-10-CM

## 2020-03-01 NOTE — Telephone Encounter (Signed)
Last ov 01/12/20 Last fill 01/03/20  #30/0

## 2020-03-02 ENCOUNTER — Other Ambulatory Visit: Payer: Self-pay | Admitting: Family Medicine

## 2020-03-02 DIAGNOSIS — M722 Plantar fascial fibromatosis: Secondary | ICD-10-CM

## 2020-03-27 ENCOUNTER — Telehealth: Payer: Self-pay | Admitting: Family Medicine

## 2020-03-27 NOTE — Progress Notes (Signed)
°  Chronic Care Management   Outreach Note  03/27/2020 Name: Paul Burke MRN: [id number] DOB: September 04, 1951  Referred by: Mliss Sax, MD Reason for referral : No chief complaint on file.   An unsuccessful telephone outreach was attempted today. The patient was referred to the pharmacist for assistance with care management and care coordination.   This note is not being shared with the patient for the following reason: To respect privacy (The patient or proxy has requested that the information not be shared).  Follow Up Plan:   Lynnae January Upstream Scheduler

## 2020-03-28 ENCOUNTER — Other Ambulatory Visit: Payer: Self-pay | Admitting: Family Medicine

## 2020-03-28 DIAGNOSIS — M722 Plantar fascial fibromatosis: Secondary | ICD-10-CM

## 2020-03-30 ENCOUNTER — Telehealth: Payer: Self-pay | Admitting: Family Medicine

## 2020-03-30 NOTE — Progress Notes (Signed)
  Chronic Care Management   Note  03/30/2020 Name: Paul Burke MRN: [id number] DOB: 1951/01/10  Paul Burke is a 69 y.o. year old male who is a primary care patient of Mliss Sax, MD. I reached out to [name] by phone today in response to a referral sent by Paul Burke PCP, Mliss Sax, MD.   Paul Burke was given information about Chronic Care Management services today including:  1. CCM service includes personalized support from designated clinical staff supervised by his physician, including individualized plan of care and coordination with other care providers 2. 24/7 contact phone numbers for assistance for urgent and routine care needs. 3. Service will only be billed when office clinical staff spend 20 minutes or more in a month to coordinate care. 4. Only one practitioner may furnish and bill the service in a calendar month. 5. The patient may stop CCM services at any time (effective at the end of the month) by phone call to the office staff.   Patient agreed to services and verbal consent obtained.  This note is not being shared with the patient for the following reason: To respect privacy (The patient or proxy has requested that the information not be shared). Follow up plan:   Lynnae January [facility]

## 2020-04-06 ENCOUNTER — Encounter: Payer: Self-pay | Admitting: Family

## 2020-04-10 ENCOUNTER — Other Ambulatory Visit: Payer: Self-pay | Admitting: Family Medicine

## 2020-04-10 DIAGNOSIS — E079 Disorder of thyroid, unspecified: Secondary | ICD-10-CM

## 2020-04-10 NOTE — Telephone Encounter (Signed)
Last OV 01/12/20 Last fill 03/01/20  #30/0

## 2020-04-12 ENCOUNTER — Other Ambulatory Visit: Payer: Self-pay

## 2020-04-13 ENCOUNTER — Ambulatory Visit (INDEPENDENT_AMBULATORY_CARE_PROVIDER_SITE_OTHER): Payer: Medicare Other | Admitting: Family Medicine

## 2020-04-13 ENCOUNTER — Encounter: Payer: Self-pay | Admitting: Family Medicine

## 2020-04-13 VITALS — BP 138/78 | HR 104 | Temp 97.9°F | Ht 71.0 in | Wt 217.4 lb

## 2020-04-13 DIAGNOSIS — N5201 Erectile dysfunction due to arterial insufficiency: Secondary | ICD-10-CM

## 2020-04-13 DIAGNOSIS — D649 Anemia, unspecified: Secondary | ICD-10-CM

## 2020-04-13 DIAGNOSIS — M109 Gout, unspecified: Secondary | ICD-10-CM

## 2020-04-13 DIAGNOSIS — E538 Deficiency of other specified B group vitamins: Secondary | ICD-10-CM

## 2020-04-13 DIAGNOSIS — I10 Essential (primary) hypertension: Secondary | ICD-10-CM

## 2020-04-13 DIAGNOSIS — E079 Disorder of thyroid, unspecified: Secondary | ICD-10-CM

## 2020-04-13 LAB — VITAMIN B12: Vitamin B-12: 251 pg/mL (ref 211–911)

## 2020-04-13 LAB — TSH: TSH: 0.89 u[IU]/mL (ref 0.35–4.50)

## 2020-04-13 LAB — CBC
HCT: 37 % — ABNORMAL LOW (ref 39.0–52.0)
Hemoglobin: 12.2 g/dL — ABNORMAL LOW (ref 13.0–17.0)
MCHC: 33 g/dL (ref 30.0–36.0)
MCV: 98.2 fl (ref 78.0–100.0)
Platelets: 176 10*3/uL (ref 150.0–400.0)
RBC: 3.77 Mil/uL — ABNORMAL LOW (ref 4.22–5.81)
RDW: 15.2 % (ref 11.5–15.5)
WBC: 5.1 10*3/uL (ref 4.0–10.5)

## 2020-04-13 MED ORDER — SILDENAFIL CITRATE 100 MG PO TABS: 50.0000 mg | ORAL_TABLET | Freq: Every day | ORAL | 3 refills | Status: DC | PRN

## 2020-04-13 MED ORDER — COLCHICINE 0.6 MG PO TABS: 0.6000 mg | ORAL_TABLET | Freq: Two times a day (BID) | ORAL | 3 refills | Status: DC

## 2020-04-13 NOTE — Progress Notes (Addendum)
Established Patient Office Visit  Subjective:  Patient ID: Paul Burke, male    DOB: 01-01-1951  Age: 69 y.o. MRN: [id number]  CC:  Chief Complaint  Patient presents with  . Follow-up    3 month follow up, no concerns.     HPI Paul Burke presents for follow-up of his hypertension, gout, hypothyroidism and anemia.  Blood pressure well controlled with amlodipine and lisinopril.  He has not had a gouty attack since his uric acid has been controlled with allopurinol.  He needs a refill on the colchicine for acute episodes.  Using meloxicam sparingly for arthritic aches and pains.  Continues Synthroid daily in the morning prior to eating for his hypothyroidism.  Requests a refill of 100 mg tablets of Viagra.  He prefers to not have to take 5 of the generic sildenafil.  Past Medical History:  Diagnosis Date  . Gout   . Hypertension   . Thyroid disease     History reviewed. No pertinent surgical history.  History reviewed. No pertinent family history.  Social History   Socioeconomic History  . Marital status: Single    Spouse name: Not on file  . Number of children: Not on file  . Years of education: Not on file  . Highest education level: Not on file  Occupational History  . Not on file  Tobacco Use  . Smoking status: Former Research scientist (life sciences)  . Smokeless tobacco: Never Used  Substance and Sexual Activity  . Alcohol use: Yes    Comment: occassionally on the weekinds.  . Drug use: Not on file  . Sexual activity: Not on file  Other Topics Concern  . Not on file  Social History Narrative  . Not on file   Social Determinants of Health   Financial Resource Strain:   . Difficulty of Paying Living Expenses:   Food Insecurity:   . Worried About Charity fundraiser in the Last Year:   . Arboriculturist in the Last Year:   Transportation Needs:   . Film/video editor (Medical):   [name] Lack of Transportation (Non-Medical):   Physical Activity:   . Days of Exercise per Week:     . Minutes of Exercise per Session:   Stress:   . Feeling of Stress :   Social Connections:   . Frequency of Communication with Friends and Family:   . Frequency of Social Gatherings with Friends and Family:   . Attends Religious Services:   . Active Member of Clubs or Organizations:   . Attends Archivist Meetings:   [name] Marital Status:   Intimate Partner Violence:   . Fear of Current or Ex-Partner:   . Emotionally Abused:   [name] Physically Abused:   . Sexually Abused:     Outpatient Medications Prior to Visit  Medication Sig Dispense Refill  . allopurinol (ZYLOPRIM) 100 MG tablet TAKE 1 TABLET BY MOUTH TWICE A DAY 180 tablet 1  . amLODipine (NORVASC) 10 MG tablet TAKE 1 TABLET BY MOUTH EVERY DAY 90 tablet 1  . aspirin 81 MG chewable tablet Chew 81 mg by mouth daily.     [name] atorvastatin (LIPITOR) 20 MG tablet TAKE 1 TABLET BY MOUTH EVERY DAY 90 tablet 3  . lisinopril (ZESTRIL) 40 MG tablet TAKE 1 BY MOUTH DAILY (MUST SCHED OV FOR FUTURE FILLS) 90 tablet 0  . meloxicam (MOBIC) 15 MG tablet TAKE 1 TABLET BY MOUTH DAILY. FOR 14 DAYS AND THEN AS NEEDED.  30 tablet 0  . omeprazole (PRILOSEC) 40 MG capsule TAKE 1 CAPSULE BY MOUTH EVERY DAY 90 capsule 1  . colchicine 0.6 MG tablet Take 1 tablet (0.6 mg total) by mouth 2 (two) times daily. As needed for attacks. 60 tablet 3  . levothyroxine (SYNTHROID) 75 MCG tablet TAKE 1 TABLET (75 MCG TOTAL) BY MOUTH DAILY BEFORE BREAKFAST. 30 tablet 0  . fluticasone (FLONASE) 50 MCG/ACT nasal spray Place 2 sprays into both nostrils daily. (Patient not taking: Reported on 04/13/2020) 16 g 6   No facility-administered medications prior to visit.    No Known Allergies  ROS Review of Systems  Constitutional: Negative.   HENT: Negative.   Eyes: Negative for photophobia and visual disturbance.  Respiratory: Negative.   Cardiovascular: Negative.   Gastrointestinal: Negative.   Endocrine: Negative for polyphagia and polyuria.  Genitourinary:  Negative for difficulty urinating, frequency and urgency.  Musculoskeletal: Negative for arthralgias and gait problem.  Skin: Negative for pallor and rash.  Neurological: Negative for light-headedness and headaches.  Hematological: Does not bruise/bleed easily.  Psychiatric/Behavioral: Negative.       Objective:    Physical Exam  Constitutional: He is oriented to person, place, and time. He appears well-developed. No distress.  HENT:  Head: Normocephalic.  Right Ear: External ear normal.  Left Ear: External ear normal.  Eyes: Conjunctivae are normal. Right eye exhibits no discharge. Left eye exhibits no discharge. No scleral icterus.  Neck: No JVD present. No tracheal deviation present. No thyromegaly present.  Cardiovascular: Normal rate, regular rhythm and normal heart sounds.  Pulmonary/Chest: Effort normal and breath sounds normal. No stridor.  Lymphadenopathy:    He has no cervical adenopathy.  Neurological: He is alert and oriented to person, place, and time.  Skin: Skin is warm and dry. He is not diaphoretic.  Psychiatric: He has a normal mood and affect. His behavior is normal.    BP 138/78   Pulse (!) 104   Temp 97.9 F (36.6 C) (Tympanic)   Ht 5\' 11"  (1.803 m)   Wt 217 lb 6.4 oz (98.6 kg)   SpO2 98%   BMI 30.32 kg/m  Wt Readings from Last 3 Encounters:  04/13/20 217 lb 6.4 oz (98.6 kg)  01/12/20 220 lb 3.2 oz (99.9 kg)  11/11/18 227 lb 2 oz (103 kg)     Health Maintenance Due  Topic Date Due  . COVID-19 Vaccine (1) Never done    There are no preventive care reminders to display for this patient.  Lab Results  Component Value Date   TSH 0.89 04/13/2020   Lab Results  Component Value Date   WBC 5.1 04/13/2020   HGB 12.2 (L) 04/13/2020   HCT 37.0 (L) 04/13/2020   MCV 98.2 04/13/2020   PLT 176.0 04/13/2020   Lab Results  Component Value Date   NA 136 01/17/2020   K 3.7 01/17/2020   CO2 21 01/17/2020   GLUCOSE 194 (H) 01/17/2020   BUN 21  01/17/2020   CREATININE 1.39 01/17/2020   BILITOT 1.0 01/17/2020   ALKPHOS 78 01/17/2020   AST 27 01/17/2020   ALT 28 01/17/2020   PROT 7.8 01/17/2020   ALBUMIN 4.2 01/17/2020   CALCIUM 9.2 01/17/2020   GFR 61.15 01/17/2020   Lab Results  Component Value Date   CHOL 134 01/17/2020   Lab Results  Component Value Date   HDL 65.50 01/17/2020   Lab Results  Component Value Date   LDLCALC 44 01/17/2020   Lab  Results  Component Value Date   TRIG 123.0 01/17/2020   Lab Results  Component Value Date   CHOLHDL 2 01/17/2020   Lab Results  Component Value Date   HGBA1C 6.7 (H) 01/17/2020      Assessment & Plan:   Problem List Items Addressed This Visit      Cardiovascular and Mediastinum   Essential hypertension - Primary   Relevant Medications   sildenafil (VIAGRA) 100 MG tablet   Erectile dysfunction due to arterial insufficiency   Relevant Medications   sildenafil (VIAGRA) 100 MG tablet     Endocrine   Thyroid disease   Relevant Medications   levothyroxine (SYNTHROID) 75 MCG tablet   Other Relevant Orders   TSH (Completed)     Other   Gout without tophus   Relevant Medications   colchicine 0.6 MG tablet   Anemia   Relevant Medications   vitamin B-12 (CYANOCOBALAMIN) 500 MCG tablet   ferrous sulfate (SLOW RELEASE IRON) 160 (50 Fe) MG TBCR SR tablet   Other Relevant Orders   CBC (Completed)   Vitamin B12 (Completed)   Iron, TIBC and Ferritin Panel (Completed)   B12 deficiency   Relevant Medications   vitamin B-12 (CYANOCOBALAMIN) 500 MCG tablet      Meds ordered this encounter  Medications  . colchicine 0.6 MG tablet    Sig: Take 1 tablet (0.6 mg total) by mouth 2 (two) times daily. As needed for attacks.    Dispense:  60 tablet    Refill:  3  . sildenafil (VIAGRA) 100 MG tablet    Sig: Take 0.5-1 tablets (50-100 mg total) by mouth daily as needed for erectile dysfunction.    Dispense:  40 tablet    Refill:  3  . levothyroxine (SYNTHROID) 75  MCG tablet    Sig: Take 1 tablet (75 mcg total) by mouth daily before breakfast.    Dispense:  30 tablet    Refill:  0  . vitamin B-12 (CYANOCOBALAMIN) 500 MCG tablet    Sig: Take 1 tablet (500 mcg total) by mouth daily.    Dispense:  100 tablet    Refill:  2  . ferrous sulfate (SLOW RELEASE IRON) 160 (50 Fe) MG TBCR SR tablet    Sig: Take 1 tablet (160 mg total) by mouth daily.    Dispense:  90 tablet    Refill:  1    Follow-up: Return in about 6 months (around 10/14/2020).    Mliss Sax, MD

## 2020-04-14 DIAGNOSIS — E538 Deficiency of other specified B group vitamins: Secondary | ICD-10-CM | POA: Insufficient documentation

## 2020-04-14 LAB — IRON,TIBC AND FERRITIN PANEL
%SAT: 27 % (calc) (ref 20–48)
Ferritin: 119 ng/mL (ref 24–380)
Iron: 86 ug/dL (ref 50–180)
TIBC: 313 mcg/dL (calc) (ref 250–425)

## 2020-04-14 MED ORDER — LEVOTHYROXINE SODIUM 75 MCG PO TABS: 75.0000 ug | ORAL_TABLET | Freq: Every day | ORAL | 0 refills | Status: DC

## 2020-04-14 MED ORDER — CYANOCOBALAMIN 500 MCG PO TABS: 500.0000 ug | ORAL_TABLET | Freq: Every day | ORAL | 2 refills | Status: DC

## 2020-04-14 MED ORDER — SLOW RELEASE IRON 160 (50 FE) MG PO TBCR: 1.0000 | EXTENDED_RELEASE_TABLET | Freq: Every day | ORAL | 1 refills | Status: DC

## 2020-04-14 NOTE — Addendum Note (Signed)
Addended by: Andrez Grime on: 04/14/2020 04:20 PM   Modules accepted: Orders

## 2020-04-24 ENCOUNTER — Other Ambulatory Visit: Payer: Self-pay | Admitting: Family Medicine

## 2020-04-24 DIAGNOSIS — I10 Essential (primary) hypertension: Secondary | ICD-10-CM

## 2020-04-25 NOTE — Chronic Care Management (AMB) (Signed)
Chronic Care Management Pharmacy  Name: Paul Burke  MRN: [id number] DOB: 02-May-1951  Chief Complaint/ HPI  Paul Burke,  69 y.o. , male presents for their Initial CCM visit with the clinical pharmacist via telephone due to COVID-19 Pandemic.  PCP : Paul Sax, MD  Their chronic conditions include: hypertension, GERD, hypothyroidism, gout, hyperlipidemia  Office Visits: 04/13/20: Patient presented to Paul Burke for HTN follow-up. BP in clinic 138/78. Iron and B12 levels trending down, patient started on Vitamin B12 and ferrous sulfate  01/12/20: Patient presented to Paul Burke for follow-up. Ferrous sulfate discontinued, patient instructed to begin multivitamin due to low hemoglobin.  11/09/19: Patient presented to Paul Burke, for AWV.   Consult Visit:None noted in past 6 months.   Medications: Outpatient Encounter Medications as of 04/26/2020  Medication Sig  . allopurinol (ZYLOPRIM) 100 MG tablet TAKE 1 TABLET BY MOUTH TWICE A DAY  . amLODipine (NORVASC) 10 MG tablet TAKE 1 TABLET BY MOUTH EVERY DAY  . aspirin 81 MG chewable tablet Chew 81 mg by mouth daily.   [name] atorvastatin (LIPITOR) 20 MG tablet TAKE 1 TABLET BY MOUTH EVERY DAY  . Cholecalciferol 50 MCG (2000 UT) CAPS Take 2,000 Units by mouth daily.  [name] levothyroxine (SYNTHROID) 75 MCG tablet Take 1 tablet (75 mcg total) by mouth daily before breakfast.  . lisinopril (ZESTRIL) 40 MG tablet TAKE 1 BY MOUTH DAILY (MUST SCHED OV FOR FUTURE FILLS)  . meloxicam (MOBIC) 15 MG tablet TAKE 1 TABLET BY MOUTH DAILY. FOR 14 DAYS AND THEN AS NEEDED.  [name] omeprazole (PRILOSEC) 40 MG capsule TAKE 1 CAPSULE BY MOUTH EVERY DAY  . sildenafil (VIAGRA) 100 MG tablet Take 0.5-1 tablets (50-100 mg total) by mouth daily as needed for erectile dysfunction.  . colchicine 0.6 MG tablet Take 1 tablet (0.6 mg total) by mouth 2 (two) times daily. As needed for attacks. (Patient not taking: Reported on 04/26/2020)  . ferrous sulfate (SLOW  RELEASE IRON) 160 (50 Fe) MG TBCR SR tablet Take 1 tablet (160 mg total) by mouth daily. (Patient not taking: Reported on 04/26/2020)  . vitamin B-12 (CYANOCOBALAMIN) 500 MCG tablet Take 1 tablet (500 mcg total) by mouth daily. (Patient not taking: Reported on 04/26/2020)   No facility-administered encounter medications on file as of 04/26/2020.     Current Diagnosis/Assessment:  Goals Addressed            This Visit's Progress   . Chronic Care Management       CARE PLAN ENTRY  Current Barriers:  . Chronic Disease Management support, education, and care coordination needs related to Hypertension, Hyperlipidemia, GERD, Hypothyroidism, and Gout   Hypertension . Pharmacist Clinical Goal(s): o Over the next 180 days, patient will work with PharmD and providers to maintain BP goal <140/90 . Current regimen:  o Amlodipine 10 mg  o Lisinopril 40 mg . Patient self care activities - Over the next 180 days, patient will: o Check blood pressure weekly, document, and provide at future appointments o Ensure daily salt intake < 2300 mg/day o Increase activity level slowly as much as you are able aiming for 150 minutes of moderate intensity (enough to get your heart rate up slightly) activity every week   Hyperlipidemia . Pharmacist Clinical Goal(s): o Over the next 180 days, patient will work with PharmD and providers to maintain LDL goal < 100 . Current regimen:  o Atorvastatin 20 mg   Medication management . Pharmacist Clinical Goal(s): o Over the  next 90 days, patient will work with PharmD and providers to maintain optimal medication adherence . [facility] . Interventions o Comprehensive medication review performed. o Utilize [facility] for medication synchronization, packaging and delivery o Verbal consent obtained for [facility] enhanced pharmacy services (medication synchronization, adherence packaging, delivery coordination). A medication sync plan was  created to allow patient to get all medications delivered once every 30 to 90 days per patient preference. Patient understands they have freedom to choose pharmacy and clinical pharmacist will coordinate care between all prescribers and [facility].  . Patient self care activities - Over the next 90 days, patient will: o Take medications as prescribed o Report any questions or concerns to PharmD and/or provider(s)       Hypertension   BP today is: n/a  Office blood pressures are  BP Readings from Last 3 Encounters:  04/13/20 138/78  01/12/20 128/72  11/11/18 138/80   CMP Latest Ref Rng & Units 01/17/2020 11/11/2018 07/29/2018  Glucose 70 - 99 mg/dL 993(Z) 169(C) 789(F)  BUN 6 - 23 mg/dL 21 81(O) 17(P)  Creatinine 0.40 - 1.50 mg/dL 1.02 5.85(I) 7.78(E)  Sodium 135 - 145 mEq/L 136 139 138  Potassium 3.5 - 5.1 mEq/L 3.7 3.6 3.8  Chloride 96 - 112 mEq/L 104 108 106  CO2 19 - 32 mEq/L 21 23 22   Calcium 8.4 - 10.5 mg/dL 9.2 9.6 9.5  Total Protein 6.0 - 8.3 g/dL 7.8 8.3 -  Total Bilirubin 0.2 - 1.2 mg/dL 1.0 0.8 -  Alkaline Phos 39 - 117 U/L 78 60 -  AST 0 - 37 U/L 27 31 -  ALT 0 - 53 U/L 28 33 -   Patient has failed these meds in the past: n/a Patient is currently controlled on the following medications:   Amlodipine 10 mg   Lisinopril 40 mg  Patient checks BP at home weekly  Patient home BP readings are ranging: Patient unsure of recent blood pressures but says they are "the same as in the doctor's office." denies any blood pressures >140/90 or < 90/60.  We discussed diet and exercise extensively. Will sometimes experience dizziness when bending over too fast, but states this happens rarely and resolves quickly. Not following any formal dietary regimen, mainly inactive although he does work part time on occasion.   Plan  Continue current medications  Hyperlipidemia   Lipid Panel     Component Value Date/Time   CHOL 134 01/17/2020 1103   TRIG 123.0  01/17/2020 1103   HDL 65.50 01/17/2020 1103   LDLCALC 44 01/17/2020 1103   LDLDIRECT 48.0 01/17/2020 1103     The 10-year ASCVD risk score 01/19/2020 DC Jr., et al., 2013) is: 18%   Values used to calculate the score:     Age: 62 years     Sex: Male     Is Non-Hispanic African American: Yes     Diabetic: No     Tobacco smoker: No     Systolic Blood Pressure: 138 mmHg     Is BP treated: Yes     HDL Cholesterol: 65.5 mg/dL     Total Cholesterol: 134 mg/dL   Patient has failed these meds in past: n/a Patient is currently controlled on the following medications:   Atorvastatin 20 mg daily   Aspirin 81 mg daily (AM)   We discussed:  diet and exercise extensively. Denies unusual bruising or bleeding with aspirin.   Plan  Continue current medications  Hypothyroidism  Lab Results  Component Value Date/Time   TSH 0.89 04/13/2020 11:25 AM   TSH 0.44 01/17/2020 11:03 AM   TSH 2.06 11/11/2018 10:24 AM    Patient has failed these meds in past: n/a Patient is currently controlled on the following medications:   Levothyroxine 75 mcg daily   We discussed: Takes levothyroxine while cooking breakfast, but doesn't necessarily space it out by at least 30 minutes. Counseled on best practice of separating levothyroxine from food/medications by at least 30 minutes and to separate from iron by at least 4 hours.    Plan  Continue current medications  GERD   Patient has failed these meds in past: n/a Patient is currently controlled on the following medications:   Omeprazole 40 mg daily (taking since 2017)   We discussed:  Patient cannot remember ever having significant GERD symptoms, and has been very well controlled since starting omeprazole.  Plan  Continue current medications. Patient may tolerate stopping omeprazole, but will defer until later visit.   Gout   Uric Acid, Serum  Date Value Ref Range Status  01/17/2020 3.9 (L) 4.0 - 7.8 mg/dL Final  11/11/2018 5.8 4.0 - 7.8 mg/dL  Final  07/29/2018 5.2 4.0 - 7.8 mg/dL Final   Patient has failed these meds in past: n/a Patient is currently controlled on the following medications:   Allopurinol 100 mg BID (AM, 3-4 pm)   Colchicine 0.6 mg BID PRN (hasn't needed)   We discussed:  Patient states last flare was ~2 years ago, has been doing well with allopurinol since then   Plan  Recommend switching allopurinol to 200 mg daily for ease of administration.   Osteoarthritis   Patient has failed these meds in past: n/a Patient is currently controlled on the following medications:   Meloxicam 15 mg daily PRN  We discussed:  Arthritis/shoulder pain well controlled with meloxicam. Patient states he takes every 2-3 days. Counseled patient to take with plenty of water and a snack/meal to minimize risk of adverse events.   Plan  Continue current medications   Misc/OTC   Slow Release ferrous sulfate 160 (50 Fe) mg daily .  Sildenafil 50-100 mg daily PRN ($[facility])  Vitamin B12 500 mcg daily  Vitamin D 50 mcg daily  Vitamin B-12  Date Value Ref Range Status  04/13/2020 251 211 - 911 pg/mL Final   We discussed:  Has not started iron supplement or vitamin B12. Unsure of what kind of iron supplement to get. Was unable [facility] at [facility] as it would cost $800.   Plan  Continue current medications. Will plan to deliver Vitamin B12 and slow iron supplements for patient through [facility] as well as investigate cost-saving options for sildenafil.   Vaccines   Reviewed and discussed patient's vaccination history.    Immunization History  Administered Date(s) Administered  . Fluad Quad(high Dose 65+) 01/12/2020  . Influenza, High Dose Seasonal PF 11/20/2017  . Influenza,inj,Quad PF,6+ Mos 10/02/2015, 11/05/2016  . Pneumococcal Conjugate-13 11/20/2017  . Pneumococcal Polysaccharide-23 10/02/2015  . Tdap 01/12/2020    Plan  Recommended patient receive Covid-19 vaccine   Medication Management    [facility] Uses pill box? Yes  Plan  Utilize [facility] for medication synchronization, packaging and delivery. Verbal consent obtained for [facility] enhanced pharmacy services (medication synchronization, adherence packaging, delivery coordination). A medication sync plan was created to allow patient to get all medications delivered once every 30 to  90 days per patient preference. Patient understands they have freedom to choose pharmacy and clinical pharmacist will coordinate care between all prescribers and [facility].  Follow up: 6 month phone visit  Garey Ham Clinical Pharmacist Corinda Gubler at [facility]  [phone]

## 2020-04-26 ENCOUNTER — Ambulatory Visit: Payer: Medicare Other

## 2020-04-26 ENCOUNTER — Telehealth: Payer: Self-pay

## 2020-04-26 DIAGNOSIS — E782 Mixed hyperlipidemia: Secondary | ICD-10-CM

## 2020-04-26 DIAGNOSIS — I10 Essential (primary) hypertension: Secondary | ICD-10-CM

## 2020-04-26 NOTE — Patient Instructions (Addendum)
Visit Information It was great speaking with you today!  Please let me know if you have any questions about our visit.  Goals Addressed            This Visit's Progress   . Chronic Care Management       CARE PLAN ENTRY  Current Barriers:  . Chronic Disease Management support, education, and care coordination needs related to Hypertension, Hyperlipidemia, GERD, Hypothyroidism, and Gout   Hypertension . Pharmacist Clinical Goal(s): o Over the next 180 days, patient will work with PharmD and providers to maintain BP goal <140/90 . Current regimen:  o Amlodipine 10 mg  o Lisinopril 40 mg . Patient self care activities - Over the next 180 days, patient will: o Check blood pressure weekly, document, and provide at future appointments o Ensure daily salt intake < 2300 mg/day o Increase activity level slowly as much as you are able aiming for 150 minutes of moderate intensity (enough to get your heart rate up slightly) activity every week   Hyperlipidemia . Pharmacist Clinical Goal(s): o Over the next 180 days, patient will work with PharmD and providers to maintain LDL goal < 100 . Current regimen:  o Atorvastatin 20 mg   Medication management . Pharmacist Clinical Goal(s): o Over the next 90 days, patient will work with PharmD and providers to maintain optimal medication adherence . [facility] . Interventions o Comprehensive medication review performed. o Utilize [facility] for medication synchronization, packaging and delivery o Verbal consent obtained for [facility] enhanced pharmacy services (medication synchronization, adherence packaging, delivery coordination). A medication sync plan was created to allow patient to get all medications delivered once every 30 to 90 days per patient preference. Patient understands they have freedom to choose pharmacy and clinical pharmacist will coordinate care between all prescribers and [facility].  . Patient  self care activities - Over the next 90 days, patient will: o Take medications as prescribed o Report any questions or concerns to PharmD and/or provider(s)       Mr. Sahagun was given information about Chronic Care Management services today including:  1. CCM service includes personalized support from designated clinical staff supervised by his physician, including individualized plan of care and coordination with other care providers 2. 24/7 contact phone numbers for assistance for urgent and routine care needs. 3. Standard insurance, coinsurance, copays and deductibles apply for chronic care management only during months in which we provide at least 20 minutes of these services. Most insurances cover these services at 100%, however patients may be responsible for any copay, coinsurance and/or deductible if applicable. This service may help you avoid the need for more expensive face-to-face services. 4. Only one practitioner may furnish and bill the service in a calendar month. 5. The patient may stop CCM services at any time (effective at the end of the month) by phone call to the office staff.  Patient agreed to services and verbal consent obtained.   The patient verbalized understanding of instructions provided today and agreed to receive a mailed copy of patient instruction and/or educational materials. Telephone follow up appointment with pharmacy team member scheduled for: 10/24/20 at 1:00 PM  Garey Ham Clinical Pharmacist Corinda Gubler at [facility]  [phone]

## 2020-04-26 NOTE — Telephone Encounter (Signed)
Brendia Sacks,  The patient wishes to use [facility] going forward. To help [place] transition the patient to the pharmacy, can you please put in new refills of his chronic medications so that we may fill them for him the next time they are due?   Thanks,  Garey Ham Clinical Pharmacist Corinda Gubler at [facility]  [phone]

## 2020-05-04 ENCOUNTER — Encounter: Payer: Self-pay | Admitting: Family

## 2020-05-08 ENCOUNTER — Other Ambulatory Visit: Payer: Self-pay

## 2020-05-08 DIAGNOSIS — M109 Gout, unspecified: Secondary | ICD-10-CM

## 2020-05-08 DIAGNOSIS — E782 Mixed hyperlipidemia: Secondary | ICD-10-CM

## 2020-05-08 DIAGNOSIS — E079 Disorder of thyroid, unspecified: Secondary | ICD-10-CM

## 2020-05-08 DIAGNOSIS — I10 Essential (primary) hypertension: Secondary | ICD-10-CM

## 2020-05-08 DIAGNOSIS — K219 Gastro-esophageal reflux disease without esophagitis: Secondary | ICD-10-CM

## 2020-05-08 MED ORDER — OMEPRAZOLE 40 MG PO CPDR: DELAYED_RELEASE_CAPSULE | ORAL | 1 refills | Status: DC

## 2020-05-08 MED ORDER — ALLOPURINOL 100 MG PO TABS: 100.0000 mg | ORAL_TABLET | Freq: Two times a day (BID) | ORAL | 1 refills | Status: DC

## 2020-05-08 MED ORDER — LEVOTHYROXINE SODIUM 75 MCG PO TABS: 75.0000 ug | ORAL_TABLET | Freq: Every day | ORAL | 1 refills | Status: DC

## 2020-05-08 MED ORDER — AMLODIPINE BESYLATE 10 MG PO TABS: 10.0000 mg | ORAL_TABLET | Freq: Every day | ORAL | 1 refills | Status: DC

## 2020-05-08 MED ORDER — ATORVASTATIN CALCIUM 20 MG PO TABS: 20.0000 mg | ORAL_TABLET | Freq: Every day | ORAL | 3 refills | Status: DC

## 2020-05-08 NOTE — Telephone Encounter (Signed)
Patient is due for medication delivery on 05/11/20 and requires refills of his medications. Can you please send in refills of the following prescriptions to [facility]?  Omeprazole 40 mg  Atorvastatin 20 mg  Allopurinol 100 mg  Amlodipine 10 mg  Levothyroxine 75 mcg   Thanks,  Garey Ham Clinical Pharmacist Corinda Gubler at [facility]  [phone]

## 2020-05-08 NOTE — Addendum Note (Signed)
Addended by: Andrez Grime on: 05/08/2020 12:49 PM   Modules accepted: Orders

## 2020-05-09 ENCOUNTER — Other Ambulatory Visit: Payer: Self-pay | Admitting: Family Medicine

## 2020-05-09 DIAGNOSIS — E079 Disorder of thyroid, unspecified: Secondary | ICD-10-CM

## 2020-05-15 NOTE — Addendum Note (Signed)
Addended by: Lake Bells on: 05/15/2020 08:56 AM   Modules accepted: Orders

## 2020-06-02 ENCOUNTER — Other Ambulatory Visit: Payer: Self-pay | Admitting: Family Medicine

## 2020-06-02 DIAGNOSIS — K219 Gastro-esophageal reflux disease without esophagitis: Secondary | ICD-10-CM

## 2020-06-21 ENCOUNTER — Other Ambulatory Visit: Payer: Self-pay

## 2020-06-22 ENCOUNTER — Encounter: Payer: Self-pay | Admitting: Family Medicine

## 2020-06-22 ENCOUNTER — Ambulatory Visit (INDEPENDENT_AMBULATORY_CARE_PROVIDER_SITE_OTHER): Payer: Medicare Other | Admitting: Family Medicine

## 2020-06-22 VITALS — BP 112/78 | HR 101 | Temp 98.2°F | Resp 16 | Ht 71.0 in | Wt 212.0 lb

## 2020-06-22 DIAGNOSIS — R7309 Other abnormal glucose: Secondary | ICD-10-CM | POA: Diagnosis not present

## 2020-06-22 DIAGNOSIS — R202 Paresthesia of skin: Secondary | ICD-10-CM | POA: Diagnosis not present

## 2020-06-22 MED ORDER — GABAPENTIN 400 MG PO CAPS: 400.0000 mg | ORAL_CAPSULE | Freq: Every day | ORAL | 2 refills | Status: DC

## 2020-06-22 NOTE — Progress Notes (Signed)
Established Patient Office Visit  Subjective:  Patient ID: Paul Burke, male    DOB: [birth year]  Age: 69 y.o. MRN: [id number]  CC:  Chief Complaint  Patient presents with  . Numbness    tingling and burning in feet, started last week    HPI Paul Burke presents for evaluation of a 1 to 2-week history of numbness and tingling in the soles of his feet.  There is burning as well.  It affects him more after he sits for a while or at night.  Denies excessive movement of his legs or feet.  No recent injury.  No appreciable back pain.  Ongoing history of hypothyroidism, B12 deficiency and iron deficiency.  He is compliant with his medications and supplements.  Mild elevation in hemoglobin A1c a few months ago.  Intentional weight loss of 8 pounds.  He feels great.  Has been exercising and watching food intake.  Past Medical History:  Diagnosis Date  . Gout   . Hypertension   . Thyroid disease     No past surgical history on file.  No family history on file.  Social History   Socioeconomic History  . Marital status: Single    Spouse name: Not on file  . Number of children: Not on file  . Years of education: Not on file  . Highest education level: Not on file  Occupational History  . Not on file  Tobacco Use  . Smoking status: Former Games developer  . Smokeless tobacco: Never Used  Substance and Sexual Activity  . Alcohol use: Yes    Comment: occassionally on the weekinds.  . Drug use: Not on file  . Sexual activity: Not on file  Other Topics Concern  . Not on file  Social History Narrative  . Not on file   Social Determinants of Health   Financial Resource Strain: Low Risk   . Difficulty of Paying Living Expenses: Not very hard  Food Insecurity:   . Worried About Programme researcher, broadcasting/film/video in the Last Year:   . Barista in the Last Year:   Transportation Needs: No Transportation Needs  . Lack of Transportation (Medical): No  . Lack of Transportation (Non-Medical): No    Physical Activity:   . Days of Exercise per Week:   . Minutes of Exercise per Session:   Stress:   . Feeling of Stress :   Social Connections:   . Frequency of Communication with Friends and Family:   . Frequency of Social Gatherings with Friends and Family:   . Attends Religious Services:   . Active Member of Clubs or Organizations:   . Attends Banker Meetings:   [name] Marital Status:   Intimate Partner Violence:   . Fear of Current or Ex-Partner:   . Emotionally Abused:   [name] Physically Abused:   . Sexually Abused:     Outpatient Medications Prior to Visit  Medication Sig Dispense Refill  . allopurinol (ZYLOPRIM) 100 MG tablet Take 1 tablet (100 mg total) by mouth 2 (two) times daily. 180 tablet 1  . amLODipine (NORVASC) 10 MG tablet Take 1 tablet (10 mg total) by mouth daily. 90 tablet 1  . aspirin 81 MG chewable tablet Chew 81 mg by mouth daily.     [name] atorvastatin (LIPITOR) 20 MG tablet Take 1 tablet (20 mg total) by mouth daily. 90 tablet 3  . Cholecalciferol 50 MCG (2000 UT) CAPS Take 2,000 Units by mouth daily.    [name]  levothyroxine (SYNTHROID) 75 MCG tablet TAKE 1 TABLET (75 MCG TOTAL) BY MOUTH DAILY BEFORE BREAKFAST. 90 tablet 3  . lisinopril (ZESTRIL) 40 MG tablet TAKE 1 BY MOUTH DAILY (MUST SCHED OV FOR FUTURE FILLS) 90 tablet 0  . meloxicam (MOBIC) 15 MG tablet TAKE 1 TABLET BY MOUTH DAILY. FOR 14 DAYS AND THEN AS NEEDED. 30 tablet 0  . omeprazole (PRILOSEC) 40 MG capsule TAKE 1 CAPSULE BY MOUTH EVERY DAY 90 capsule 1  . sildenafil (VIAGRA) 100 MG tablet Take 0.5-1 tablets (50-100 mg total) by mouth daily as needed for erectile dysfunction. 40 tablet 3   No facility-administered medications prior to visit.    No Known Allergies  ROS Review of Systems  Constitutional: Negative.   HENT: Negative.   Eyes: Negative for photophobia and visual disturbance.  Respiratory: Negative.   Cardiovascular: Negative.   Gastrointestinal: Negative.   Endocrine: Negative  for polyphagia and polyuria.  Genitourinary: Negative.   Musculoskeletal: Negative for gait problem and joint swelling.  Skin: Negative for pallor and rash.  Neurological: Positive for numbness. Negative for weakness.  Hematological: Does not bruise/bleed easily.  Psychiatric/Behavioral: Negative.       Objective:    Physical Exam Vitals and nursing note reviewed.  Constitutional:      General: He is not in acute distress.    Appearance: Normal appearance. He is not ill-appearing or toxic-appearing.  HENT:     Head: Normocephalic and atraumatic.     Right Ear: External ear normal.  Eyes:     General: No scleral icterus.       Right eye: No discharge.        Left eye: No discharge.     Conjunctiva/sclera: Conjunctivae normal.  Cardiovascular:     Pulses:          Dorsalis pedis pulses are 2+ on the right side and 2+ on the left side.       Posterior tibial pulses are 1+ on the right side and 1+ on the left side.  Neurological:     Mental Status: He is alert.     BP 112/78 (BP Location: Left Arm, Patient Position: Sitting, Cuff Size: Normal)   Pulse 101   Temp 98.2 F (36.8 C) (Oral)   Resp 16   Ht 5\' 11"  (1.803 m)   Wt (!) 212 lb (96.2 kg)   SpO2 98%   BMI 29.57 kg/m  Wt Readings from Last 3 Encounters:  06/22/20 (!) 212 lb (96.2 kg)  04/13/20 217 lb 6.4 oz (98.6 kg)  01/12/20 220 lb 3.2 oz (99.9 kg)     Health Maintenance Due  Topic Date Due  . COVID-19 Vaccine (1) Never done    There are no preventive care reminders to display for this patient.  Lab Results  Component Value Date   TSH 0.89 04/13/2020   Lab Results  Component Value Date   WBC 5.1 04/13/2020   HGB 12.2 (L) 04/13/2020   HCT 37.0 (L) 04/13/2020   MCV 98.2 04/13/2020   PLT 176.0 04/13/2020   Lab Results  Component Value Date   NA 136 01/17/2020   K 3.7 01/17/2020   CO2 21 01/17/2020   GLUCOSE 194 (H) 01/17/2020   BUN 21 01/17/2020   CREATININE 1.39 01/17/2020   BILITOT 1.0  01/17/2020   ALKPHOS 78 01/17/2020   AST 27 01/17/2020   ALT 28 01/17/2020   PROT 7.8 01/17/2020   ALBUMIN 4.2 01/17/2020   CALCIUM 9.2 01/17/2020  GFR 61.15 01/17/2020   Lab Results  Component Value Date   CHOL 134 01/17/2020   Lab Results  Component Value Date   HDL 65.50 01/17/2020   Lab Results  Component Value Date   LDLCALC 44 01/17/2020   Lab Results  Component Value Date   TRIG 123.0 01/17/2020   Lab Results  Component Value Date   CHOLHDL 2 01/17/2020   Lab Results  Component Value Date   HGBA1C 6.7 (H) 01/17/2020      Assessment & Plan:   Problem List Items Addressed This Visit      Other   Elevated glucose   Relevant Orders   Hemoglobin A1c   Paresthesias - Primary   Relevant Medications   gabapentin (NEURONTIN) 400 MG capsule   Other Relevant Orders   RPR   HIV antibody (with reflex)      Meds ordered this encounter  Medications  . DISCONTD: gabapentin (NEURONTIN) 400 MG capsule    Sig: Take 1 capsule (400 mg total) by mouth at bedtime.    Dispense:  30 capsule    Refill:  2  . gabapentin (NEURONTIN) 400 MG capsule    Sig: Take 1 capsule (400 mg total) by mouth at bedtime.    Dispense:  90 capsule    Refill:  2    Follow-up: Return return for appointment in November..   Be sure to continue B12 supplementation, levothyroxine and iron therapy.  We will start with Neurontin nightly. Mliss Sax, MD

## 2020-06-25 LAB — HEMOGLOBIN A1C
Hgb A1c MFr Bld: 5.5 % of total Hgb (ref <5.7)
Mean Plasma Glucose: 111 (calc)
eAG (mmol/L): 6.2 (calc)

## 2020-06-25 LAB — RPR: RPR Ser Ql: NONREACTIVE

## 2020-06-25 LAB — HIV ANTIBODY (ROUTINE TESTING W REFLEX): HIV 1&2 Ab, 4th Generation: NONREACTIVE

## 2020-06-26 ENCOUNTER — Encounter (HOSPITAL_COMMUNITY): Payer: Self-pay | Admitting: *Deleted

## 2020-06-26 ENCOUNTER — Other Ambulatory Visit: Payer: Self-pay

## 2020-06-26 ENCOUNTER — Emergency Department (HOSPITAL_COMMUNITY)
Admission: EM | Admit: 2020-06-26 | Discharge: 2020-06-27 | Disposition: A | Discharge status: Home / Self Care | Payer: Medicare Other | Attending: Emergency Medicine | Admitting: Emergency Medicine

## 2020-06-26 DIAGNOSIS — F10920 Alcohol use, unspecified with intoxication, uncomplicated: Secondary | ICD-10-CM

## 2020-06-26 DIAGNOSIS — J80 Acute respiratory distress syndrome: Secondary | ICD-10-CM | POA: Diagnosis not present

## 2020-06-26 DIAGNOSIS — R61 Generalized hyperhidrosis: Secondary | ICD-10-CM | POA: Diagnosis not present

## 2020-06-26 DIAGNOSIS — I10 Essential (primary) hypertension: Secondary | ICD-10-CM | POA: Insufficient documentation

## 2020-06-26 DIAGNOSIS — R21 Rash and other nonspecific skin eruption: Secondary | ICD-10-CM | POA: Insufficient documentation

## 2020-06-26 DIAGNOSIS — Y929 Unspecified place or not applicable: Secondary | ICD-10-CM | POA: Diagnosis not present

## 2020-06-26 DIAGNOSIS — R000 Tachycardia, unspecified: Secondary | ICD-10-CM | POA: Diagnosis not present

## 2020-06-26 DIAGNOSIS — X19XXXA Contact with other heat and hot substances, initial encounter: Secondary | ICD-10-CM | POA: Diagnosis not present

## 2020-06-26 DIAGNOSIS — Y999 Unspecified external cause status: Secondary | ICD-10-CM | POA: Diagnosis not present

## 2020-06-26 DIAGNOSIS — T300 Burn of unspecified body region, unspecified degree: Secondary | ICD-10-CM | POA: Diagnosis not present

## 2020-06-26 DIAGNOSIS — T22211A Burn of second degree of right forearm, initial encounter: Secondary | ICD-10-CM | POA: Diagnosis not present

## 2020-06-26 DIAGNOSIS — Y939 Activity, unspecified: Secondary | ICD-10-CM | POA: Insufficient documentation

## 2020-06-26 DIAGNOSIS — Z87891 Personal history of nicotine dependence: Secondary | ICD-10-CM | POA: Insufficient documentation

## 2020-06-26 DIAGNOSIS — W19XXXA Unspecified fall, initial encounter: Secondary | ICD-10-CM | POA: Diagnosis not present

## 2020-06-26 DIAGNOSIS — R531 Weakness: Secondary | ICD-10-CM | POA: Diagnosis not present

## 2020-06-26 DIAGNOSIS — R55 Syncope and collapse: Secondary | ICD-10-CM | POA: Diagnosis not present

## 2020-06-26 DIAGNOSIS — F10120 Alcohol abuse with intoxication, uncomplicated: Secondary | ICD-10-CM | POA: Diagnosis not present
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
## 2020-06-27 ENCOUNTER — Emergency Department (HOSPITAL_COMMUNITY): Payer: Medicare Other

## 2020-06-27 DIAGNOSIS — T22211A Burn of second degree of right forearm, initial encounter: Secondary | ICD-10-CM | POA: Diagnosis not present

## 2020-06-27 DIAGNOSIS — R55 Syncope and collapse: Secondary | ICD-10-CM | POA: Diagnosis not present

## 2020-06-27 LAB — BASIC METABOLIC PANEL
Anion gap: 16 — ABNORMAL HIGH (ref 5–15)
BUN: 25 mg/dL — ABNORMAL HIGH (ref 8–23)
CO2: 14 mmol/L — ABNORMAL LOW (ref 22–32)
Calcium: 9.2 mg/dL (ref 8.9–10.3)
Chloride: 100 mmol/L (ref 98–111)
Creatinine, Ser: 1.91 mg/dL — ABNORMAL HIGH (ref 0.61–1.24)
GFR calc Af Amer: 40 mL/min — ABNORMAL LOW (ref >60)
GFR calc non Af Amer: 35 mL/min — ABNORMAL LOW (ref >60)
Glucose, Bld: 106 mg/dL — ABNORMAL HIGH (ref 70–99)
Potassium: 4 mmol/L (ref 3.5–5.1)
Sodium: 130 mmol/L — ABNORMAL LOW (ref 135–145)

## 2020-06-27 LAB — CBC WITH DIFFERENTIAL/PLATELET
Abs Immature Granulocytes: 0.05 10*3/uL (ref 0.00–0.07)
Basophils Absolute: 0 10*3/uL (ref 0.0–0.1)
Basophils Relative: 0 %
Eosinophils Absolute: 0.1 10*3/uL (ref 0.0–0.5)
Eosinophils Relative: 1 %
HCT: 31.7 % — ABNORMAL LOW (ref 39.0–52.0)
Hemoglobin: 10.6 g/dL — ABNORMAL LOW (ref 13.0–17.0)
Immature Granulocytes: 1 %
Lymphocytes Relative: 18 %
Lymphs Abs: 1.7 10*3/uL (ref 0.7–4.0)
MCH: 31.9 pg (ref 26.0–34.0)
MCHC: 33.4 g/dL (ref 30.0–36.0)
MCV: 95.5 fL (ref 80.0–100.0)
Monocytes Absolute: 1 10*3/uL (ref 0.1–1.0)
Monocytes Relative: 11 %
Neutro Abs: 6.7 10*3/uL (ref 1.7–7.7)
Neutrophils Relative %: 69 %
Platelets: 161 10*3/uL (ref 150–400)
RBC: 3.32 MIL/uL — ABNORMAL LOW (ref 4.22–5.81)
RDW: 13.4 % (ref 11.5–15.5)
WBC: 9.6 10*3/uL (ref 4.0–10.5)
nRBC: 0 % (ref 0.0–0.2)

## 2020-06-27 LAB — MAGNESIUM: Magnesium: 1.8 mg/dL (ref 1.7–2.4)

## 2020-06-27 LAB — TROPONIN I (HIGH SENSITIVITY): Troponin I (High Sensitivity): 9 ng/L (ref <18)

## 2020-06-27 MED ORDER — BACITRACIN ZINC 500 UNIT/GM EX OINT
1.0000 | TOPICAL_OINTMENT | Freq: Two times a day (BID) | CUTANEOUS | 0 refills | Status: AC
Start: 2020-06-27 — End: 2020-07-04

## 2020-06-27 MED ORDER — SODIUM CHLORIDE 0.9 % IV BOLUS
1000.0000 mL | Freq: Once | INTRAVENOUS | Status: AC
  Administered 2020-06-27: 1000 mL via INTRAVENOUS
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
## 2020-07-12 ENCOUNTER — Telehealth: Payer: Self-pay

## 2020-07-12 DIAGNOSIS — E782 Mixed hyperlipidemia: Secondary | ICD-10-CM

## 2020-07-12 DIAGNOSIS — I10 Essential (primary) hypertension: Secondary | ICD-10-CM
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
## 2020-07-17 ENCOUNTER — Other Ambulatory Visit: Payer: Self-pay | Admitting: Family Medicine

## 2020-07-17 DIAGNOSIS — I10 Essential (primary) hypertension: Secondary | ICD-10-CM

## 2020-07-17 NOTE — Progress Notes (Signed)
Patient with symptoms of dizziness, recommend starting meclizine 25 mg q8hr PRN. If this does not help, recommend office visit with PCP to further evaluate symptoms.   Garey Ham Clinical Pharmacist [facility]  [phone]

## 2020-07-26 ENCOUNTER — Telehealth: Payer: Self-pay
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
## 2020-07-27 ENCOUNTER — Telehealth: Payer: Self-pay

## 2020-07-27 NOTE — Progress Notes (Signed)
Spoke to patient to inform him per clinical pharmacist [facility] is working on his medication delivery. Inform patient if he wants to get the meclizine OTC. It costs $7.38 for a full bottle and he cannot use his [employer] benefits on it. Please also instruct him to schedule an appointment at the clinic to go over his dizziness.   Patient verbalize understanding.Patient states he would like to purchase meclizine OTC. Patient reports he is going to make a appointment with his PCP "soon" for his dizziness.  Everlean Cherry Clinical Pharmacist Assistant [phone]

## 2020-07-31 ENCOUNTER — Other Ambulatory Visit: Payer: Self-pay

## 2020-07-31 DIAGNOSIS — M722 Plantar fascial fibromatosis: Secondary | ICD-10-CM

## 2020-07-31 NOTE — Telephone Encounter (Signed)
Patient calling for refill on pending medication last OV 06/22/2020. If approved would [facility]. Please advise.

## 2020-08-01 MED ORDER — MELOXICAM 15 MG PO TABS: 15.0000 mg | ORAL_TABLET | Freq: Every day | ORAL | 0 refills | Status: DC | PRN

## 2020-08-09 ENCOUNTER — Other Ambulatory Visit: Payer: Self-pay | Admitting: Family Medicine

## 2020-08-09 DIAGNOSIS — E782 Mixed hyperlipidemia: Secondary | ICD-10-CM

## 2020-08-10 ENCOUNTER — Telehealth: Payer: Self-pay

## 2020-08-10 DIAGNOSIS — I10 Essential (primary) hypertension: Secondary | ICD-10-CM

## 2020-08-10 DIAGNOSIS — E782 Mixed hyperlipidemia: Secondary | ICD-10-CM

## 2020-08-10 NOTE — Progress Notes (Addendum)
Chronic Care Management Pharmacy Assistant   Name: Paul Burke  MRN: [id number] DOB: 1951/03/08  Reason for Encounter: Medication Review   PCP : Mliss Sax, MD  Allergies:  No Known Allergies  Medications: Outpatient Encounter Medications as of 08/10/2020  Medication Sig Note  . allopurinol (ZYLOPRIM) 100 MG tablet Take 1 tablet (100 mg total) by mouth 2 (two) times daily.   [name] amLODipine (NORVASC) 10 MG tablet Take 1 tablet (10 mg total) by mouth daily.   [name] aspirin 81 MG chewable tablet Chew 81 mg by mouth daily.    [name] atorvastatin (LIPITOR) 20 MG tablet TAKE 1 TABLET BY MOUTH EVERY DAY   . Cholecalciferol 50 MCG (2000 UT) CAPS Take 2,000 Units by mouth daily.   . ferrous sulfate 325 (65 FE) MG EC tablet Take 325 mg by mouth daily with breakfast.   . gabapentin (NEURONTIN) 400 MG capsule Take 1 capsule (400 mg total) by mouth at bedtime. 06/27/2020: Has not started  . levothyroxine (SYNTHROID) 75 MCG tablet TAKE 1 TABLET (75 MCG TOTAL) BY MOUTH DAILY BEFORE BREAKFAST.   [name] lisinopril (ZESTRIL) 40 MG tablet TAKE ONE TABLET BY MOUTH EVERY MORNING   . meloxicam (MOBIC) 15 MG tablet Take 1 tablet (15 mg total) by mouth daily as needed for pain.   [name] omeprazole (PRILOSEC) 40 MG capsule TAKE 1 CAPSULE BY MOUTH EVERY DAY (Patient taking differently: Take 40 mg by mouth daily. )   . sildenafil (VIAGRA) 100 MG tablet Take 0.5-1 tablets (50-100 mg total) by mouth daily as needed for erectile dysfunction.   . vitamin B-12 (CYANOCOBALAMIN) 500 MCG tablet Take 500 mcg by mouth daily.    No facility-administered encounter medications on file as of 08/10/2020.    Current Diagnosis: Patient Active Problem List   Diagnosis Date Noted  . Paresthesias 06/22/2020  . B12 deficiency 04/14/2020  . Erectile dysfunction due to arterial insufficiency 04/13/2020  . History of gout 01/12/2020  . Iron deficiency 01/12/2020  . Pain of right hip joint 10/05/2018  . Plantar fasciitis of right  foot 07/29/2018  . Elevated glucose 03/11/2018  . Tachycardia 12/22/2017  . Anemia 12/22/2017  . Ear pain, left 12/22/2017  . Drinks alcohol 12/22/2017  . Elevated pulse rate 11/20/2017  . Healthcare maintenance 11/20/2017  . Need for Tdap vaccination 11/20/2017  . Need for influenza vaccination 11/20/2017  . History of hepatitis C 11/20/2017  . Traumatic closed fracture of distal clavicle with minimal displacement, left, with routine healing, subsequent encounter 12/10/2016  . Mixed hyperlipidemia 10/02/2015  . Thyroid disease 10/02/2015  . Essential hypertension 03/06/2015  . Gastroesophageal reflux disease 03/06/2015  . Gout without tophus 03/06/2015    Goals Addressed   None     Follow-Up:  Pharmacist Review   Reviewed chart for medication changes ahead of medication coordination call.  No OVs, Consults, or hospital visits since last care coordination call/Pharmacist visit. (If appropriate, list visit date, provider name)  No medication changes indicated OR if recent visit, treatment plan here.  BP Readings from Last 3 Encounters:  06/27/20 114/68  06/22/20 112/78  04/13/20 138/78    Lab Results  Component Value Date   HGBA1C 5.5 06/22/2020     Patient obtains medications through Vials  90 Days   Last adherence delivery included: (medication name and frequency) Allopurinol 100 mg tablet BID Breakfast, Bedtime Amlodipine 10 mg tablet Daily Breakfast Atorvastatin 20 mg tablet Daily Breakfast Ferrous sulfate 325 MG  tablet Daily Breakfast  Gabapentin 400 MG  Capsule Daily Bedtime Levothyroxine 75 MCG tablet Daily before Breakfast Lisinopril 40 MG tablet Daily breakfast Meloxicam 45 mg tablet PRN - Vial  Omeprazole 40 mg capsule Daily Breakfast Sildenafil 100 mg tablet PRN - Vial Vitamin B-12 500 Mcg Tablet Daily Breakfast  Patient declined (meds) last month due to PRN use/additional supply on hand. Asprin 81 mg tablet Daily Breakfast (OTC, Adequate  supply) Vitamin D 2000 units daily (OTC, Adequate supply)    Patient is due for next adherence delivery on: 08/27/2020. Called patient and reviewed medications and coordinated delivery.  This delivery to include: Amlodipine 10 mg tablet Daily Breakfast  Levothyroxine 75 MCG tablet Daily before Breakfast Lisinopril 40 MG tablet Daily breakfast  Patients states he still has adequate supply from last delivery.  Patient declined the following medications (meds) due to (reason)  Allopurinol 100 mg tablet BID Breakfast, Bedtime (adequate supply) Atorvastatin 20 mg tablet Daily Breakfast (adequate Supply) Ferrous sulfate 325 MG  tablet Daily Breakfast (OTC) Gabapentin 400 MG  Capsule Daily Bedtime (adequate supply) Meloxicam 45 mg tablet PRN - Vial (OTC) Omeprazole 40 mg capsule Daily Breakfast (adequate supply) Sildenafil 100 mg tablet PRN - Vial (adequate supply) Vitamin B-12 500 Mcg Tablet Daily Breakfast (OTC) Asprin 81 mg tablet Daily Breakfast (OTC, Adequate supply) Vitamin D 2000 units daily (OTC, Adequate supply)  Meclizine 25 Tablet (OTC)  Patient needs refills for none ID.  Confirmed delivery date of 08/27/2020, advised patient that pharmacy will contact them the morning of delivery.  Everlean Cherry Clinical Pharmacist Assistant [phone]

## 2020-08-24 ENCOUNTER — Other Ambulatory Visit: Payer: Self-pay | Admitting: Family Medicine

## 2020-08-24 DIAGNOSIS — I10 Essential (primary) hypertension: Secondary | ICD-10-CM

## 2020-09-10 ENCOUNTER — Telehealth: Payer: Self-pay

## 2020-09-10 NOTE — Progress Notes (Signed)
Chronic Care Management Pharmacy Assistant   Name: Paul Burke  MRN: [id number] DOB: 1951/10/21  Reason for Encounter: Medication Review   PCP : Mliss Sax, MD  Allergies:  No Known Allergies  Medications: Outpatient Encounter Medications as of 09/10/2020  Medication Sig Note  . allopurinol (ZYLOPRIM) 100 MG tablet Take 1 tablet (100 mg total) by mouth 2 (two) times daily.   [name] amLODipine (NORVASC) 10 MG tablet TAKE ONE TABLET BY MOUTH EVERY MORNING   . aspirin 81 MG chewable tablet Chew 81 mg by mouth daily.    [name] atorvastatin (LIPITOR) 20 MG tablet TAKE 1 TABLET BY MOUTH EVERY DAY   . Cholecalciferol 50 MCG (2000 UT) CAPS Take 2,000 Units by mouth daily.   . ferrous sulfate 325 (65 FE) MG EC tablet Take 325 mg by mouth daily with breakfast.   . gabapentin (NEURONTIN) 400 MG capsule Take 1 capsule (400 mg total) by mouth at bedtime. 06/27/2020: Has not started  . levothyroxine (SYNTHROID) 75 MCG tablet TAKE 1 TABLET (75 MCG TOTAL) BY MOUTH DAILY BEFORE BREAKFAST.   [name] lisinopril (ZESTRIL) 40 MG tablet TAKE ONE TABLET BY MOUTH EVERY MORNING   . meloxicam (MOBIC) 15 MG tablet Take 1 tablet (15 mg total) by mouth daily as needed for pain.   [name] omeprazole (PRILOSEC) 40 MG capsule TAKE 1 CAPSULE BY MOUTH EVERY DAY (Patient taking differently: Take 40 mg by mouth daily. )   . sildenafil (VIAGRA) 100 MG tablet Take 0.5-1 tablets (50-100 mg total) by mouth daily as needed for erectile dysfunction.   . vitamin B-12 (CYANOCOBALAMIN) 500 MCG tablet Take 500 mcg by mouth daily.    No facility-administered encounter medications on file as of 09/10/2020.    Current Diagnosis: Patient Active Problem List   Diagnosis Date Noted  . Paresthesias 06/22/2020  . B12 deficiency 04/14/2020  . Erectile dysfunction due to arterial insufficiency 04/13/2020  . History of gout 01/12/2020  . Iron deficiency 01/12/2020  . Pain of right hip joint 10/05/2018  . Plantar fasciitis of right foot  07/29/2018  . Elevated glucose 03/11/2018  . Tachycardia 12/22/2017  . Anemia 12/22/2017  . Ear pain, left 12/22/2017  . Drinks alcohol 12/22/2017  . Elevated pulse rate 11/20/2017  . Healthcare maintenance 11/20/2017  . Need for Tdap vaccination 11/20/2017  . Need for influenza vaccination 11/20/2017  . History of hepatitis C 11/20/2017  . Traumatic closed fracture of distal clavicle with minimal displacement, left, with routine healing, subsequent encounter 12/10/2016  . Mixed hyperlipidemia 10/02/2015  . Thyroid disease 10/02/2015  . Essential hypertension 03/06/2015  . Gastroesophageal reflux disease 03/06/2015  . Gout without tophus 03/06/2015     Follow-Up:  Pharmacist Review   Reviewed chart for medication changes ahead of medication coordination call.  No OVs, Consults, or hospital visits since last care coordination call/Pharmacist visit.   No medication changes indicated   BP Readings from Last 3 Encounters:  06/27/20 114/68  06/22/20 112/78  04/13/20 138/78    Lab Results  Component Value Date   HGBA1C 5.5 06/22/2020     Patient obtains medications through Vials  90 Days   Last adherence delivery included: (medication name and frequency)  Amlodipine 10 mg tablet Daily Breakfast         Levothyroxine 75 MCG tablet Daily before Breakfast  Lisinopril 40 MG tablet Daily breakfast  Patient declined (meds) last month due to PRN use/additional supply on hand.  Allopurinol 100 mg tablet BID Breakfast,  Bedtime (adequate supply)  Atorvastatin 20 mg tablet Daily Breakfast (adequate Supply)  Ferrous sulfate 325 MG tablet Daily Breakfast (OTC)  Gabapentin 400 MG Capsule Daily Bedtime (adequate supply)  Meloxicam 45 mg tablet PRN- Vial(OTC)  Omeprazole 40 mg capsule Daily Breakfast (adequate supply)  Sildenafil 100 mg tablet PRN- Vial (adequate supply)  Vitamin B-12 500 Mcg TabletDaily Breakfast (OTC)  Asprin 81 mg tablet Daily Breakfast (OTC, Adequate  supply)  Vitamin D 2000 units daily (OTC, Adequate supply)  Meclizine 25 Tablet (OTC)   Patient is due for next adherence delivery on: 09/18/2020. Called patient and reviewed medications and coordinated delivery.  This delivery to include:  Omeprazole 40 mg capsule Daily Breakfast   Patient declined the following medications (meds) due to (reason)  Amlodipine 10 mg tablet Daily Breakfast  (adequate supply)   Levothyroxine 75 MCG tablet Daily before Breakfast (adequate supply)   Lisinopril 40 MG tablet Daily breakfast (adequate supply)    Allopurinol 100 mg tablet BID Breakfast, Bedtime (adequate supply)  Atorvastatin 20 mg tablet Daily Breakfast (adequate Supply)  Ferrous sulfate 325 MG tablet Daily Breakfast (OTC)  Gabapentin 400 MG Capsule Daily Bedtime (adequate supply)  Meloxicam 45 mg tablet PRN- Vial(OTC)  Sildenafil 100 mg tablet PRN- Vial (adequate supply)  Vitamin B-12 500 Mcg TabletDaily Breakfast (OTC)  Aspirin 81 mg tablet Daily Breakfast (OTC, Adequate supply)  Vitamin D 2000 units daily (OTC, Adequate supply)  Meclizine 25 Tablet (OTC)  Patient needs refills for None ID.  Confirmed delivery date of 09/18/2020, advised patient that pharmacy will contact them the morning of delivery.  Everlean Cherry Clinical Pharmacist Assistant [phone]

## 2020-09-13 IMAGING — DX DG CHEST 1V PORT
1 series · 1 of 1 positions shown · non-contrast
Comparison: None.

CLINICAL DATA: Syncopal episode.

EXAM:
PORTABLE CHEST 1 VIEW

[chest ap]
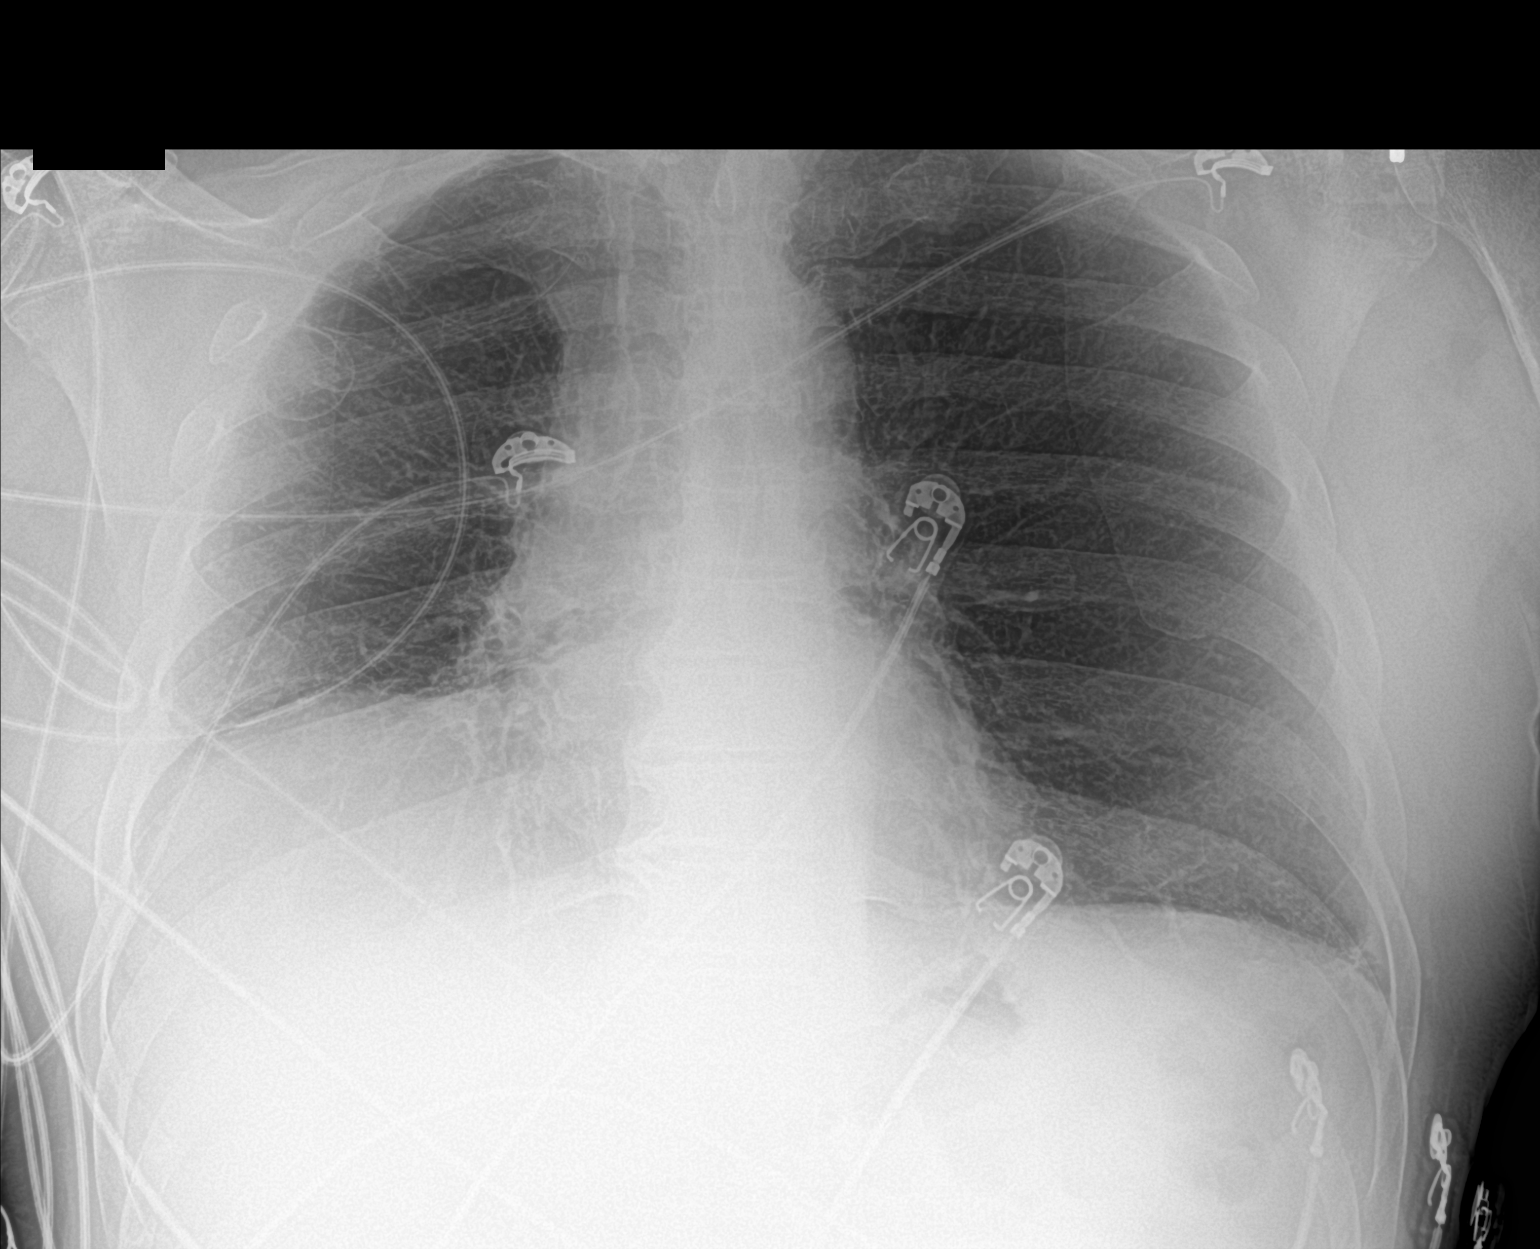

[1 of 1 positions shown; findings below may reference images not displayed]

FINDINGS: Cardiac silhouette is normal in size. No mediastinal or hilar
masses.

Mild prominence of the lung vascular markings. Minor scarring in the
left lateral lung base. Lungs otherwise clear. Elevated right
hemidiaphragm. No convincing pleural effusion and no pneumothorax.

Skeletal structures are demineralized but grossly intact.
IMPRESSION: No acute cardiopulmonary disease.

## 2020-09-17 ENCOUNTER — Other Ambulatory Visit: Payer: Self-pay | Admitting: Family Medicine

## 2020-09-17 DIAGNOSIS — K219 Gastro-esophageal reflux disease without esophagitis: Secondary | ICD-10-CM

## 2020-10-10 ENCOUNTER — Telehealth: Payer: Self-pay

## 2020-10-10 DIAGNOSIS — M722 Plantar fascial fibromatosis: Secondary | ICD-10-CM

## 2020-10-10 MED ORDER — MELOXICAM 15 MG PO TABS: 15.0000 mg | ORAL_TABLET | Freq: Every day | ORAL | 0 refills | Status: DC | PRN

## 2020-10-10 NOTE — Progress Notes (Signed)
Patient requesting refills for allopurinol 100 mg tablet, and Meloxicam 15 mg tablet (Patient prefers 90 Days supply).  Sent request to clinical Pharmacist.  Everlean Cherry Clinical Pharmacist Assistant [phone]

## 2020-10-10 NOTE — Telephone Encounter (Signed)
Last fill 08/01/20 #30/0 Last OV 06/22/20

## 2020-10-12 ENCOUNTER — Encounter: Payer: Self-pay | Admitting: Family

## 2020-10-12 ENCOUNTER — Ambulatory Visit (INDEPENDENT_AMBULATORY_CARE_PROVIDER_SITE_OTHER): Payer: Medicare Other | Admitting: Family

## 2020-10-12 ENCOUNTER — Other Ambulatory Visit: Payer: Self-pay

## 2020-10-12 VITALS — BP 124/76 | HR 103 | Temp 97.9°F | Ht 71.0 in | Wt 228.4 lb

## 2020-10-12 DIAGNOSIS — R200 Anesthesia of skin: Secondary | ICD-10-CM

## 2020-10-12 DIAGNOSIS — R202 Paresthesia of skin: Secondary | ICD-10-CM

## 2020-10-12 DIAGNOSIS — I10 Essential (primary) hypertension: Secondary | ICD-10-CM | POA: Diagnosis not present

## 2020-10-12 NOTE — Progress Notes (Signed)
Acute Office Visit  Subjective:    Patient ID: Paul Burke, male    DOB: 04/12/1951, 69 y.o.   MRN: [id number]  Chief Complaint  Patient presents with  . Numbness    complains of tingling in feet and tips of fingers x 1 month symptoms becoming worse.     HPI Patient is in today with concerns of numbness and tingling in his hands and feet x 1 month. It waxes and wanes in intensity but never completely goes away. He reports starting Gabapentin in July that has helped the burning sensation in his feet. He was not taking the medication daily but is taking it about every other day consistently now. No pain. Has arthritis in his hands. No swelling  Past Medical History:  Diagnosis Date  . Gout   . Hypertension   . Thyroid disease     History reviewed. No pertinent surgical history.  History reviewed. No pertinent family history.  Social History   Socioeconomic History  . Marital status: Single    Spouse name: Not on file  . Number of children: Not on file  . Years of education: Not on file  . Highest education level: Not on file  Occupational History  . Not on file  Tobacco Use  . Smoking status: Former Research scientist (life sciences)  . Smokeless tobacco: Never Used  Substance and Sexual Activity  . Alcohol use: Yes    Comment: occassionally on the weekinds.  . Drug use: Not on file  . Sexual activity: Not on file  Other Topics Concern  . Not on file  Social History Narrative  . Not on file   Social Determinants of Health   Financial Resource Strain: Low Risk   . Difficulty of Paying Living Expenses: Not very hard  Food Insecurity:   . Worried About Charity fundraiser in the Last Year: Not on file  . Ran Out of Food in the Last Year: Not on file  Transportation Needs: No Transportation Needs  . Lack of Transportation (Medical): No  . Lack of Transportation (Non-Medical): No  Physical Activity:   . Days of Exercise per Week: Not on file  . Minutes of Exercise per Session: Not on  file  Stress:   . Feeling of Stress : Not on file  Social Connections:   . Frequency of Communication with Friends and Family: Not on file  . Frequency of Social Gatherings with Friends and Family: Not on file  . Attends Religious Services: Not on file  . Active Member of Clubs or Organizations: Not on file  . Attends Archivist Meetings: Not on file  . Marital Status: Not on file  Intimate Partner Violence:   . Fear of Current or Ex-Partner: Not on file  . Emotionally Abused: Not on file  . Physically Abused: Not on file  . Sexually Abused: Not on file    Outpatient Medications Prior to Visit  Medication Sig Dispense Refill  . allopurinol (ZYLOPRIM) 100 MG tablet Take 1 tablet (100 mg total) by mouth 2 (two) times daily. 180 tablet 1  . amLODipine (NORVASC) 10 MG tablet TAKE ONE TABLET BY MOUTH EVERY MORNING 90 tablet 1  . aspirin 81 MG chewable tablet Chew 81 mg by mouth daily.     [name] atorvastatin (LIPITOR) 20 MG tablet TAKE 1 TABLET BY MOUTH EVERY DAY 90 tablet 3  . Cholecalciferol 50 MCG (2000 UT) CAPS Take 2,000 Units by mouth daily.    . ferrous  sulfate 325 (65 FE) MG EC tablet Take 325 mg by mouth daily with breakfast.    . gabapentin (NEURONTIN) 400 MG capsule Take 1 capsule (400 mg total) by mouth at bedtime. 90 capsule 2  . levothyroxine (SYNTHROID) 75 MCG tablet TAKE 1 TABLET (75 MCG TOTAL) BY MOUTH DAILY BEFORE BREAKFAST. 90 tablet 3  . lisinopril (ZESTRIL) 40 MG tablet TAKE ONE TABLET BY MOUTH EVERY MORNING 90 tablet 1  . meloxicam (MOBIC) 15 MG tablet Take 1 tablet (15 mg total) by mouth daily as needed for pain. 30 tablet 0  . omeprazole (PRILOSEC) 40 MG capsule Take 1 capsule (40 mg total) by mouth daily. 90 capsule 1  . sildenafil (VIAGRA) 100 MG tablet Take 0.5-1 tablets (50-100 mg total) by mouth daily as needed for erectile dysfunction. 40 tablet 3  . vitamin B-12 (CYANOCOBALAMIN) 500 MCG tablet Take 500 mcg by mouth daily.     No facility-administered  medications prior to visit.    No Known Allergies  Review of Systems  Eyes: Negative.   Respiratory: Negative.   Cardiovascular: Negative.   Gastrointestinal: Negative.   Endocrine: Negative.   Musculoskeletal: Positive for arthralgias. Negative for back pain, joint swelling and myalgias.  Skin: Negative.   Allergic/Immunologic: Negative.   Neurological: Positive for numbness. Negative for facial asymmetry and weakness.  Hematological: Negative.   Psychiatric/Behavioral: Negative.   All other systems reviewed and are negative.      Objective:    Physical Exam Constitutional:      Appearance: Normal appearance. He is normal weight.  Eyes:     Pupils: Pupils are equal, round, and reactive to light.  Cardiovascular:     Rate and Rhythm: Normal rate and regular rhythm.  Pulmonary:     Effort: Pulmonary effort is normal.     Breath sounds: Normal breath sounds.  Abdominal:     General: Abdomen is flat. Bowel sounds are normal.     Palpations: Abdomen is soft.  Musculoskeletal:        General: No swelling. Normal range of motion.     Cervical back: Normal range of motion and neck supple.  Skin:    General: Skin is warm and dry.  Neurological:     Mental Status: He is alert. Mental status is at baseline. He is disoriented.     Cranial Nerves: No cranial nerve deficit.     Sensory: No sensory deficit.     Motor: No weakness.     Coordination: Coordination normal.     Gait: Gait normal.     Deep Tendon Reflexes: Reflexes normal.  Psychiatric:        Mood and Affect: Mood normal.        Behavior: Behavior normal.     BP 124/76   Pulse (!) 103   Temp 97.9 F (36.6 C) (Tympanic)   Ht _0  (1.803 m)   Wt 228 lb 6.4 oz (103.6 kg)   SpO2 98%   BMI 31.86 kg/m  Wt Readings from Last 3 Encounters:  10/12/20 228 lb 6.4 oz (103.6 kg)  06/22/20 (!) 212 lb (96.2 kg)  04/13/20 217 lb 6.4 oz (98.6 kg)    Health Maintenance Due  Topic Date Due  . COVID-19 Vaccine (1)  Never done  . INFLUENZA VACCINE  07/01/2020    There are no preventive care reminders to display for this patient.   Lab Results  Component Value Date   TSH 0.89 04/13/2020   Lab Results  Component Value Date   WBC 9.6 06/27/2020   HGB 10.6 (L) 06/27/2020   HCT 31.7 (L) 06/27/2020   MCV 95.5 06/27/2020   PLT 161 06/27/2020   Lab Results  Component Value Date   NA 130 (L) 06/27/2020   K 4.0 06/27/2020   CO2 14 (L) 06/27/2020   GLUCOSE 106 (H) 06/27/2020   BUN 25 (H) 06/27/2020   CREATININE 1.91 (H) 06/27/2020   BILITOT 1.0 01/17/2020   ALKPHOS 78 01/17/2020   AST 27 01/17/2020   ALT 28 01/17/2020   PROT 7.8 01/17/2020   ALBUMIN 4.2 01/17/2020   CALCIUM 9.2 06/27/2020   ANIONGAP 16 (H) 06/27/2020   GFR 61.15 01/17/2020   Lab Results  Component Value Date   CHOL 134 01/17/2020   Lab Results  Component Value Date   HDL 65.50 01/17/2020   Lab Results  Component Value Date   LDLCALC 44 01/17/2020   Lab Results  Component Value Date   TRIG 123.0 01/17/2020   Lab Results  Component Value Date   CHOLHDL 2 01/17/2020   Lab Results  Component Value Date   HGBA1C 5.5 06/22/2020       Assessment & Plan:   Problem List Items Addressed This Visit    Essential hypertension    Other Visit Diagnoses    Numbness and tingling in both hands    -  Primary   Relevant Orders   CBC w/Diff   TSH   Comp Met (CMET)   Tingling of both feet       Relevant Orders   CBC w/Diff   TSH   Comp Met (CMET)       Suspect Gabapentin as a source but labs from July was slightly abnormal and patient has a history of hypothyroidism. Will check labs and see if a culprit exist for his symptoms.We will consider a drug holiday from Gabapentin if labs are normal.    Kennyth Arnold, FNP

## 2020-10-12 NOTE — Patient Instructions (Signed)
Paresthesia Paresthesia is a burning or prickling feeling. This feeling can happen in any part of the body. It often happens in the hands, arms, legs, or feet. Usually, it is not painful. In most cases, the feeling goes away in a short time and is not a sign of a serious problem. If you have paresthesia that lasts a long time, you may need to be seen by your doctor. Follow these instructions at home: Alcohol use   Do not drink alcohol if: ? Your doctor tells you not to drink. ? You are pregnant, may be pregnant, or are planning to become pregnant.  If you drink alcohol: ? Limit how much you use to:  0-1 drink a day for women.  0-2 drinks a day for men. ? Be aware of how much alcohol is in your drink. In the [place] one drink equals one 12 oz bottle of beer (355 mL), one 5 oz glass of wine (148 mL), or one 1 oz glass of hard liquor (44 mL). Nutrition   Eat a healthy diet. This includes: ? Eating foods that have a lot of fiber in them, such as fresh fruits and vegetables, whole grains, and beans. ? Limiting foods that have a lot of fat and processed sugars in them, such as fried or sweet foods. General instructions  Take over-the-counter and prescription medicines only as told by your doctor.  Do not use any products that have nicotine or tobacco in them, such as cigarettes and e-cigarettes. If you need help quitting, ask your doctor.  If you have diabetes, work with your doctor to make sure your blood sugar stays in a healthy range.  If your feet feel numb: ? Check for redness, warmth, and swelling every day. ? Wear padded socks and comfortable shoes. These help protect your feet.  Keep all follow-up visits as told by your doctor. This is important. Contact a doctor if:  You have paresthesia that gets worse or does not go away.  Your burning or prickling feeling gets worse when you walk.  You have pain or cramps.  You feel dizzy.  You have a rash. Get help right away if  you:  Feel weak.  Have trouble walking or moving.  Have problems speaking, understanding, or seeing.  Feel confused.  Cannot control when you pee (urinate) or poop (have a bowel movement).  Lose feeling (have numbness) after an injury.  Have new weakness in an arm or leg.  Pass out (faint). Summary  Paresthesia is a burning or prickling feeling. It often happens in the hands, arms, legs, or feet.  In most cases, the feeling goes away in a short time and is not a sign of a serious problem.  If you have paresthesia that lasts a long time, you may need to be seen by your doctor. This information is not intended to replace advice given to you by your health care provider. Make sure you discuss any questions you have with your health care provider. Document Revised: 12/13/2018 Document Reviewed: 11/26/2017 Elsevier Patient Education  2020 Elsevier Inc.  

## 2020-10-13 LAB — CBC WITH DIFFERENTIAL/PLATELET
Basophils Absolute: 0 10*3/uL (ref 0.0–0.2)
Basos: 1 %
EOS (ABSOLUTE): 0.2 10*3/uL (ref 0.0–0.4)
Eos: 3 %
Hematocrit: 34.8 % — ABNORMAL LOW (ref 37.5–51.0)
Hemoglobin: 11.7 g/dL — ABNORMAL LOW (ref 13.0–17.7)
Immature Grans (Abs): 0 10*3/uL (ref 0.0–0.1)
Immature Granulocytes: 0 %
Lymphocytes Absolute: 1.9 10*3/uL (ref 0.7–3.1)
Lymphs: 26 %
MCH: 30.5 pg (ref 26.6–33.0)
MCHC: 33.6 g/dL (ref 31.5–35.7)
MCV: 91 fL (ref 79–97)
Monocytes Absolute: 0.5 10*3/uL (ref 0.1–0.9)
Monocytes: 7 %
Neutrophils Absolute: 4.6 10*3/uL (ref 1.4–7.0)
Neutrophils: 63 %
Platelets: 195 10*3/uL (ref 150–450)
RBC: 3.83 x10E6/uL — ABNORMAL LOW (ref 4.14–5.80)
RDW: 13.4 % (ref 11.6–15.4)
WBC: 7.3 10*3/uL (ref 3.4–10.8)

## 2020-10-13 LAB — COMPREHENSIVE METABOLIC PANEL
ALT: 28 IU/L (ref 0–44)
AST: 29 IU/L (ref 0–40)
Albumin/Globulin Ratio: 1.5 (ref 1.2–2.2)
Albumin: 4.5 g/dL (ref 3.8–4.8)
Alkaline Phosphatase: 72 IU/L (ref 44–121)
BUN/Creatinine Ratio: 19 (ref 10–24)
BUN: 25 mg/dL (ref 8–27)
Bilirubin Total: 0.4 mg/dL (ref 0.0–1.2)
CO2: 19 mmol/L — ABNORMAL LOW (ref 20–29)
Calcium: 9.2 mg/dL (ref 8.6–10.2)
Chloride: 108 mmol/L — ABNORMAL HIGH (ref 96–106)
Creatinine, Ser: 1.31 mg/dL — ABNORMAL HIGH (ref 0.76–1.27)
GFR calc Af Amer: 63 mL/min/{1.73_m2} (ref >59)
GFR calc non Af Amer: 55 mL/min/{1.73_m2} — ABNORMAL LOW (ref >59)
Globulin, Total: 3 g/dL (ref 1.5–4.5)
Glucose: 118 mg/dL — ABNORMAL HIGH (ref 65–99)
Potassium: 4.4 mmol/L (ref 3.5–5.2)
Sodium: 142 mmol/L (ref 134–144)
Total Protein: 7.5 g/dL (ref 6.0–8.5)

## 2020-10-13 LAB — TSH: TSH: 0.535 u[IU]/mL (ref 0.450–4.500)

## 2020-10-24 ENCOUNTER — Telehealth: Payer: Medicare Other
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
## 2020-10-30 ENCOUNTER — Telehealth: Payer: Self-pay | Admitting: Family Medicine

## 2020-10-30 ENCOUNTER — Telehealth: Payer: Self-pay
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
## 2020-10-30 NOTE — Telephone Encounter (Signed)
Left message for patient to schedule Annual Wellness Visit.  Please schedule with Nurse Health Advisor Martha Stanley, RN at [facility]  °

## 2020-11-06 ENCOUNTER — Telehealth: Payer: Self-pay

## 2020-11-06 NOTE — Progress Notes (Signed)
Chronic Care Management Pharmacy Assistant   Name: Paul Burke  MRN: [id number] DOB: [birth year]  Reason for Encounter: Medication Review  PCP : Mliss Sax, MD  Allergies:  No Known Allergies  Medications: Outpatient Encounter Medications as of 11/06/2020  Medication Sig Note  . allopurinol (ZYLOPRIM) 100 MG tablet Take 1 tablet (100 mg total) by mouth 2 (two) times daily.   [name] amLODipine (NORVASC) 10 MG tablet TAKE ONE TABLET BY MOUTH EVERY MORNING   . aspirin 81 MG chewable tablet Chew 81 mg by mouth daily.    [name] atorvastatin (LIPITOR) 20 MG tablet TAKE 1 TABLET BY MOUTH EVERY DAY   . Cholecalciferol 50 MCG (2000 UT) CAPS Take 2,000 Units by mouth daily.   . ferrous sulfate 325 (65 FE) MG EC tablet Take 325 mg by mouth daily with breakfast.   . gabapentin (NEURONTIN) 400 MG capsule Take 1 capsule (400 mg total) by mouth at bedtime. 06/27/2020: Has not started  . levothyroxine (SYNTHROID) 75 MCG tablet TAKE 1 TABLET (75 MCG TOTAL) BY MOUTH DAILY BEFORE BREAKFAST.   [name] lisinopril (ZESTRIL) 40 MG tablet TAKE ONE TABLET BY MOUTH EVERY MORNING   . meloxicam (MOBIC) 15 MG tablet Take 1 tablet (15 mg total) by mouth daily as needed for pain.   [name] omeprazole (PRILOSEC) 40 MG capsule Take 1 capsule (40 mg total) by mouth daily.   . sildenafil (VIAGRA) 100 MG tablet Take 0.5-1 tablets (50-100 mg total) by mouth daily as needed for erectile dysfunction.   . vitamin B-12 (CYANOCOBALAMIN) 500 MCG tablet Take 500 mcg by mouth daily.    No facility-administered encounter medications on file as of 11/06/2020.    Current Diagnosis: Patient Active Problem List   Diagnosis Date Noted  . Paresthesias 06/22/2020  . B12 deficiency 04/14/2020  . Erectile dysfunction due to arterial insufficiency 04/13/2020  . History of gout 01/12/2020  . Iron deficiency 01/12/2020  . Pain of right hip joint 10/05/2018  . Plantar fasciitis of right foot 07/29/2018  . Elevated glucose 03/11/2018  .  Tachycardia 12/22/2017  . Anemia 12/22/2017  . Ear pain, left 12/22/2017  . Drinks alcohol 12/22/2017  . Elevated pulse rate 11/20/2017  . Healthcare maintenance 11/20/2017  . Need for Tdap vaccination 11/20/2017  . Need for influenza vaccination 11/20/2017  . History of hepatitis C 11/20/2017  . Traumatic closed fracture of distal clavicle with minimal displacement, left, with routine healing, subsequent encounter 12/10/2016  . Mixed hyperlipidemia 10/02/2015  . Thyroid disease 10/02/2015  . Essential hypertension 03/06/2015  . Gastroesophageal reflux disease 03/06/2015  . Gout without tophus 03/06/2015     Follow-Up:  Pharmacist Review   Reviewed chart for medication changes ahead of medication coordination call.  OVs, Consults, or hospital visits since last care coordination call/Pharmacist visit.  10/12/2020 - [facility] Hopes No medication changes indicated   BP Readings from Last 3 Encounters:  10/12/20 124/76  06/27/20 114/68  06/22/20 112/78    Lab Results  Component Value Date   HGBA1C 5.5 06/22/2020     Patient obtains medications through Vials  90 Days   Last adherence delivery included: (medication name and frequency)   Omeprazole 40 mg capsule Daily Breakfast    Patient declined the following medications (meds) due to (reason)             Amlodipine 10 mg tablet Daily Breakfast(adequate supply)             Levothyroxine 75 MCG tablet  Daily before Breakfast (adequate supply)             Lisinopril 40 MG tablet Daily breakfast (adequate supply)              Allopurinol 100 mg tablet BID Breakfast, Bedtime(adequate supply)             Atorvastatin 20 mg tablet Daily Breakfast(adequate Supply)             Ferrous sulfate 325 MG tablet Daily Breakfast(OTC)             Gabapentin 400 MG Capsule Daily Bedtime(adequate supply)             Meloxicam 45 mg tablet PRN- Vial(OTC)             Sildenafil 100 mg tablet PRN- Vial(adequate  supply)             Vitamin B-12 500 Mcg TabletDaily Breakfast(OTC)             Aspirin 81 mg tablet Daily Breakfast (OTC, Adequate supply)             Vitamin D 2000 units daily (OTC, Adequate supply)             Meclizine 25 Tablet (OTC)   Patient is due for next adherence delivery on: 11/26/2020. Called patient and reviewed medications and coordinated delivery.  This delivery to include:   Amlodipine 10 mg tablet Daily Breakfast             Levothyroxine 75 MCG tablet Daily before Breakfast              Lisinopril 40 MG tablet Daily breakfast               Allopurinol 100 mg tablet BID Breakfast, Bedtime  Coordinated acute fill for (med) to be delivered (date).  Gabapentin 400 MG Capsule Daily Bedtime to be deliver on 11/12/2020    Meloxicam 15 mg tablet PRN- Vialto be deliver on 11/12/2020  Patient declined the following medications (meds) due to (reason)  Atorvastatin 20 mg tablet Daily Breakfast(adequate Supply, patient [facility])   Ferrous sulfate 325 MG tablet Daily Breakfast(OTC)   Sildenafil 100 mg tablet PRN- Vial(adequate supply)             Vitamin B-12 500 Mcg TabletDaily Breakfast(OTC)             Aspirin 81 mg tablet Daily Breakfast (OTC, Adequate supply)             Vitamin D 2000 units daily (OTC, Adequate supply)             Meclizine 25 Tablet (OTC)  Omeprazole 40 mg capsule Daily Breakfast(Adequate Supply)  Patient needs refills for Allopurinol 100 mg tablet, Levothyroxine 75 MCG tablet, Meloxicam 15 mg tablet reached out to PCP to request refills on 11/07/2020.[name]  Confirmed delivery date of 11/26/2020 , advised patient that pharmacy will contact them the morning of delivery.  Everlean Cherry Clinical Pharmacist Assistant [phone]

## 2020-11-07 ENCOUNTER — Telehealth: Payer: Self-pay | Admitting: Family Medicine

## 2020-11-07 NOTE — Telephone Encounter (Signed)
[facility] is calling to get refills on patients Allopurinol 100mg , Levothyroxine , and Meloxicam 15mg . They are also requesting a 90 day supply of these medications. If approved, please send in or call pharmacy at [phone].

## 2020-11-09 NOTE — Telephone Encounter (Signed)
Please see message and advise.  Thank you. ° °

## 2020-11-11 ENCOUNTER — Other Ambulatory Visit: Payer: Self-pay | Admitting: Family

## 2020-11-11 DIAGNOSIS — M722 Plantar fascial fibromatosis: Secondary | ICD-10-CM

## 2020-11-11 DIAGNOSIS — E079 Disorder of thyroid, unspecified: Secondary | ICD-10-CM

## 2020-11-11 DIAGNOSIS — M109 Gout, unspecified: Secondary | ICD-10-CM

## 2020-11-11 MED ORDER — MELOXICAM 15 MG PO TABS: 15.0000 mg | ORAL_TABLET | Freq: Every day | ORAL | 0 refills | Status: DC | PRN

## 2020-11-11 MED ORDER — ALLOPURINOL 100 MG PO TABS: 100.0000 mg | ORAL_TABLET | Freq: Two times a day (BID) | ORAL | 1 refills | Status: DC

## 2020-11-11 MED ORDER — LEVOTHYROXINE SODIUM 75 MCG PO TABS: 75.0000 ug | ORAL_TABLET | Freq: Every day | ORAL | 3 refills | Status: DC

## 2020-11-12 ENCOUNTER — Other Ambulatory Visit: Payer: Self-pay | Admitting: Family Medicine

## 2020-11-12 DIAGNOSIS — E079 Disorder of thyroid, unspecified: Secondary | ICD-10-CM

## 2020-11-12 NOTE — Progress Notes (Signed)
Subjective:   Paul Burke is a 69 y.o. male who presents for Medicare Annual/Subsequent preventive examination.  I connected with Britt today by telephone and verified that I am speaking with the correct person using two identifiers. Location patient: home Location provider: work Persons participating in the virtual visit: patient, Engineer, civil (consulting).    I discussed the limitations, risks, security and privacy concerns of performing an evaluation and management service by telephone and the availability of in person appointments. I also discussed with the patient that there may be a patient responsible charge related to this service. The patient expressed understanding and verbally consented to this telephonic visit.    Interactive audio and video telecommunications were attempted between this provider and patient, however failed, due to patient having technical difficulties OR patient did not have access to video capability.  We continued and completed visit with audio only.  Some vital signs may be absent or patient reported.   Time Spent with patient on telephone encounter: 30 minutes   Review of Systems     Cardiac Risk Factors include: advanced age (>45men, >83 women);dyslipidemia;male gender;obesity (BMI >30kg/m2)     Objective:    Today's Vitals   11/13/20 1248  Weight: 228 lb (103.4 kg)  Height: 5\' 11"  (1.803 m)   Body mass index is 31.8 kg/m.  Advanced Directives 11/13/2020 06/26/2020 11/09/2019 12/03/2016  Does Patient Have a Medical Advance Directive? No No No No  Would patient like information on creating a medical advance directive? Yes (MAU/Ambulatory/Procedural Areas - Information given) No - Patient declined No - Patient declined No - Patient declined    Current Medications (verified) Outpatient Encounter Medications as of 11/13/2020  Medication Sig  . allopurinol (ZYLOPRIM) 100 MG tablet Take 1 tablet (100 mg total) by mouth 2 (two) times daily.  11/15/2020 amLODipine (NORVASC)  10 MG tablet TAKE ONE TABLET BY MOUTH EVERY MORNING  . aspirin 81 MG chewable tablet Chew 81 mg by mouth daily.   [name] atorvastatin (LIPITOR) 20 MG tablet TAKE 1 TABLET BY MOUTH EVERY DAY  . Cholecalciferol 50 MCG (2000 UT) CAPS Take 2,000 Units by mouth daily.  . ferrous sulfate 325 (65 FE) MG EC tablet Take 325 mg by mouth daily with breakfast.  . levothyroxine (SYNTHROID) 75 MCG tablet Take 1 tablet (75 mcg total) by mouth daily before breakfast.  . lisinopril (ZESTRIL) 40 MG tablet TAKE ONE TABLET BY MOUTH EVERY MORNING  . meloxicam (MOBIC) 15 MG tablet Take 1 tablet (15 mg total) by mouth daily as needed for pain.  [name] omeprazole (PRILOSEC) 40 MG capsule Take 1 capsule (40 mg total) by mouth daily.  . sildenafil (VIAGRA) 100 MG tablet Take 0.5-1 tablets (50-100 mg total) by mouth daily as needed for erectile dysfunction.  . vitamin B-12 (CYANOCOBALAMIN) 500 MCG tablet Take 500 mcg by mouth daily.  [name] gabapentin (NEURONTIN) 400 MG capsule Take 1 capsule (400 mg total) by mouth at bedtime.   No facility-administered encounter medications on file as of 11/13/2020.    Allergies (verified) Patient has no known allergies.   History: Past Medical History:  Diagnosis Date  . Gout   . Hypertension   . Thyroid disease    History reviewed. No pertinent surgical history. History reviewed. No pertinent family history. Social History   Socioeconomic History  . Marital status: Single    Spouse name: Not on file  . Number of children: Not on file  . Years of education: Not on file  . Highest  education level: Not on file  Occupational History  . Not on file  Tobacco Use  . Smoking status: Former Games developermoker  . Smokeless tobacco: Never Used  Substance and Sexual Activity  . Alcohol use: Yes    Comment: occassionally on the weekinds.  . Drug use: Not on file  . Sexual activity: Not on file  Other Topics Concern  . Not on file  Social History Narrative  . Not on file   Social Determinants of  Health   Financial Resource Strain: Low Risk   . Difficulty of Paying Living Expenses: Not very hard  Food Insecurity: No Food Insecurity  . Worried About Programme researcher, broadcasting/film/videounning Out of Food in the Last Year: Never true  . Ran Out of Food in the Last Year: Never true  Transportation Needs: No Transportation Needs  . Lack of Transportation (Medical): No  . Lack of Transportation (Non-Medical): No  Physical Activity: Inactive  . Days of Exercise per Week: 0 days  . Minutes of Exercise per Session: 0 min  Stress: No Stress Concern Present  . Feeling of Stress : Not at all  Social Connections: Socially Isolated  . Frequency of Communication with Friends and Family: More than three times a week  . Frequency of Social Gatherings with Friends and Family: More than three times a week  . Attends Religious Services: Never  . Active Member of Clubs or Organizations: No  . Attends BankerClub or Organization Meetings: Never  . Marital Status: Never married    Tobacco Counseling Counseling given: Not Answered   Clinical Intake:  Pre-visit preparation completed: Yes  Pain : No/denies pain     Nutritional Status: BMI > 30  Obese Nutritional Risks: None Diabetes: No  How often do you need to have someone help you when you read instructions, pamphlets, or other written materials from your doctor or pharmacy?: 1 - Never What is the last grade level you completed in school?: 11th grade  Diabetic?No  Interpreter Needed?: No  Information entered by :: Thomasenia SalesMartha Shakeel Disney LPN   Activities of Daily Living In your present state of health, do you have any difficulty performing the following activities: 11/13/2020  Hearing? N  Vision? N  Difficulty concentrating or making decisions? N  Walking or climbing stairs? N  Dressing or bathing? N  Doing errands, shopping? N  Preparing Food and eating ? N  Using the Toilet? N  In the past six months, have you accidently leaked urine? N  Do you have problems with loss  of bowel control? N  Managing your Medications? N  Managing your Finances? N  Housekeeping or managing your Housekeeping? N  Some recent data might be Burke    Patient Care Team: Mliss SaxKremer, William Alfred, MD as PCP - General (Family Medicine) Gaspar ColaFleury, Alexandre A, [facility]PH as Pharmacist (Pharmacist)  Indicate any recent Medical Services you may have received from other than [facility] providers in the past year (date may be approximate).     Assessment:   This is a routine wellness examination for Paul HiddenGary.  Hearing/Vision screen  Hearing Screening   125Hz  250Hz  500Hz  1000Hz  2000Hz  3000Hz  4000Hz  6000Hz  8000Hz   Right ear:           Left ear:           Comments: No issues  Vision Screening Comments: Wears glasses Last eye exam-2-3 yrs ago  Dietary issues and exercise activities discussed: Current Exercise Habits: The patient does not participate in regular exercise at present, Exercise limited  by: None identified  Goals    . Chronic Care Management     CARE PLAN ENTRY  Current Barriers:  . Chronic Disease Management support, education, and care coordination needs related to Hypertension, Hyperlipidemia, GERD, Hypothyroidism, and Gout   Hypertension . Pharmacist Clinical Goal(s): o Over the next 180 days, patient will work with PharmD and providers to maintain BP goal <140/90 . Current regimen:  o Amlodipine 10 mg  o Lisinopril 40 mg . Patient self care activities - Over the next 180 days, patient will: o Check blood pressure weekly, document, and provide at future appointments o Ensure daily salt intake < 2300 mg/day o Increase activity level slowly as much as you are able aiming for 150 minutes of moderate intensity (enough to get your heart rate up slightly) activity every week   Hyperlipidemia . Pharmacist Clinical Goal(s): o Over the next 180 days, patient will work with PharmD and providers to maintain LDL goal < 100 . Current regimen:  o Atorvastatin 20 mg   Medication  management . Pharmacist Clinical Goal(s): o Over the next 90 days, patient will work with PharmD and providers to maintain optimal medication adherence . [facility] . Interventions o Comprehensive medication review performed. o Utilize [facility] for medication synchronization, packaging and delivery o Verbal consent obtained for [facility] enhanced pharmacy services (medication synchronization, adherence packaging, delivery coordination). A medication sync plan was created to allow patient to get all medications delivered once every 30 to 90 days per patient preference. Patient understands they have freedom to choose pharmacy and clinical pharmacist will coordinate care between all prescribers and [facility].  . Patient self care activities - Over the next 90 days, patient will: o Take medications as prescribed o Report any questions or concerns to PharmD and/or provider(s)    . Increase physical activity      Depression Screen PHQ 2/9 Scores 11/13/2020 01/12/2020 01/12/2020 11/09/2019 12/19/2016  PHQ - 2 Score 0 0 0 0 0  PHQ- 9 Score - 0 - - -  Exception Documentation - - - - Other- indicate reason in comment box    Fall Risk Fall Risk  11/13/2020 01/12/2020 11/09/2019 12/19/2016  Falls in the past year? 1 0 0 No  Number falls in past yr: 0 - - -  Injury with Fall? 0 - - -  Risk for fall due to : History of fall(s) - - Other (Comment)  Follow up Falls prevention discussed - - -    FALL RISK PREVENTION PERTAINING TO THE HOME:  Any stairs in or around the home? Yes  If so, are there any without handrails? No  Home free of loose throw rugs in walkways, pet beds, electrical cords, etc? Yes  Adequate lighting in your home to reduce risk of falls? Yes   ASSISTIVE DEVICES UTILIZED TO PREVENT FALLS:  Life alert? No  Use of a cane, walker or w/c? No  Grab bars in the bathroom? Yes  Shower chair or bench in shower? No  Elevated toilet seat or a handicapped  toilet? No   TIMED UP AND GO:  Was the test performed? No .phone visit   Cognitive Function:Normal cognitive status assessed by  this Nurse Health Advisor. No abnormalities found.          Immunizations Immunization History  Administered Date(s) Administered  . Fluad Quad(high Dose 65+) 01/12/2020  . Influenza, High Dose Seasonal PF 11/20/2017  . Influenza,inj,Quad PF,6+ Mos 10/02/2015, 11/05/2016  . Pneumococcal Conjugate-13  11/20/2017  . Pneumococcal Polysaccharide-23 10/02/2015  . Tdap 01/12/2020    TDAP status: Up to date  Flu Vaccine status: Due, Education has been provided regarding the importance of this vaccine. Advised may receive this vaccine at local pharmacy or Health Dept. Aware to provide a copy of the vaccination record if obtained from local pharmacy or Health Dept. Verbalized acceptance and understanding.  Pneumococcal vaccine status: Up to date  Covid-19 vaccine status: Completed vaccines Patient unsure of dates or which vaccine he had. He is to bring his card to his upcoming appt .  Qualifies for Shingles Vaccine? Yes   Zostavax completed No   Shingrix Completed?: No.    Education has been provided regarding the importance of this vaccine. Patient has been advised to call insurance company to determine out of pocket expense if they have not yet received this vaccine. Advised may also receive vaccine at local pharmacy or Health Dept. Verbalized acceptance and understanding.  Screening Tests Health Maintenance  Topic Date Due  . COVID-19 Vaccine (1) Never done  . INFLUENZA VACCINE  07/01/2020  . COLONOSCOPY  08/30/2024  . TETANUS/TDAP  01/11/2030  . Hepatitis C Screening  Completed  . PNA vac Low Risk Adult  Completed    Health Maintenance  Health Maintenance Due  Topic Date Due  . COVID-19 Vaccine (1) Never done  . INFLUENZA VACCINE  07/01/2020    Colorectal cancer screening: Type of screening: Colonoscopy. Completed 08/30/2014. Repeat every 10  years  Lung Cancer Screening: (Low Dose CT Chest recommended if Age 63-80 years, 30 pack-year currently smoking OR have quit w/in 15years.) does not qualify.     Additional Screening:  Hepatitis C Screening:Completed 01/17/2020  Vision Screening: Recommended annual ophthalmology exams for early detection of glaucoma and other disorders of the eye. Is the patient up to date with their annual eye exam?  No  Who is the provider or what is the name of the office in which the patient attends annual eye exams? Unsure Patient plans to make an appt   Dental Screening: Recommended annual dental exams for proper oral hygiene  Community Resource Referral / Chronic Care Management: CRR required this visit?  No   CCM required this visit?  No      Plan:     I have personally reviewed and noted the following in the patient's chart:   . Medical and social history . Use of alcohol, tobacco or illicit drugs  . Current medications and supplements . Functional ability and status . Nutritional status . Physical activity . Advanced directives . List of other physicians . Hospitalizations, surgeries, and ER visits in previous 12 months . Vitals . Screenings to include cognitive, depression, and falls . Referrals and appointments  In addition, I have reviewed and discussed with patient certain preventive protocols, quality metrics, and best practice recommendations. A written personalized care plan for preventive services as well as general preventive health recommendations were provided to patient.   Due to this being a telephonic visit, the after visit summary with patients personalized plan was offered to patient via mail or my-chart. Patient preferred to pick up at office at next visit.   Roanna Raider, LPN   [phone]  Nurse Health Advisor  Nurse Notes: None

## 2020-11-13 ENCOUNTER — Ambulatory Visit (INDEPENDENT_AMBULATORY_CARE_PROVIDER_SITE_OTHER): Payer: Medicare Other

## 2020-11-13 VITALS — Ht 71.0 in | Wt 228.0 lb

## 2020-11-13 DIAGNOSIS — Z0000 Encounter for general adult medical examination without abnormal findings: Secondary | ICD-10-CM | POA: Diagnosis not present

## 2020-11-13 NOTE — Patient Instructions (Signed)
Paul Burke , Thank you for taking time to complete your Medicare Wellness Visit. I appreciate your ongoing commitment to your health goals. Please review the following plan we discussed and let me know if I can assist you in the future.   Screening recommendations/referrals: Colonoscopy: Completed 08/30/2014-Due 08/30/2024 Recommended yearly ophthalmology/optometry visit for glaucoma screening and checkup Recommended yearly dental visit for hygiene and checkup  Vaccinations: Influenza vaccine: Due-May obtain vaccine at our office or your local pharmacy. Pneumococcal vaccine: Completed vaccines Tdap vaccine: Up to date-Due 01/11/2021 Shingles vaccine: Discuss with pharmacy  Covid-19: Completed vaccines. Please bring documentation for your chart.  Advanced directives: Information mailed today.  Conditions/risks identified: See problem list  Next appointment: Follow up in one year for your annual wellness visit. 11/19/21 @ 12:45  Preventive Care 65 Years and Older, Male Preventive care refers to lifestyle choices and visits with your health care provider that can promote health and wellness. What does preventive care include?  A yearly physical exam. This is also called an annual well check.  Dental exams once or twice a year.  Routine eye exams. Ask your health care provider how often you should have your eyes checked.  Personal lifestyle choices, including:  Daily care of your teeth and gums.  Regular physical activity.  Eating a healthy diet.  Avoiding tobacco and drug use.  Limiting alcohol use.  Practicing safe sex.  Taking low doses of aspirin every day.  Taking vitamin and mineral supplements as recommended by your health care provider. What happens during an annual well check? The services and screenings done by your health care provider during your annual well check will depend on your age, overall health, lifestyle risk factors, and family history of  disease. Counseling  Your health care provider may ask you questions about your:  Alcohol use.  Tobacco use.  Drug use.  Emotional well-being.  Home and relationship well-being.  Sexual activity.  Eating habits.  History of falls.  Memory and ability to understand (cognition).  Work and work Astronomer. Screening  You may have the following tests or measurements:  Height, weight, and BMI.  Blood pressure.  Lipid and cholesterol levels. These may be checked every 5 years, or more frequently if you are over 44 years old.  Skin check.  Lung cancer screening. You may have this screening every year starting at age 70 if you have a 30-pack-year history of smoking and currently smoke or have quit within the past 15 years.  Fecal occult blood test (FOBT) of the stool. You may have this test every year starting at age 70.  Flexible sigmoidoscopy or colonoscopy. You may have a sigmoidoscopy every 5 years or a colonoscopy every 10 years starting at age 70.  Prostate cancer screening. Recommendations will vary depending on your family history and other risks.  Hepatitis C blood test.  Hepatitis B blood test.  Sexually transmitted disease (STD) testing.  Diabetes screening. This is done by checking your blood sugar (glucose) after you have not eaten for a while (fasting). You may have this done every 1-3 years.  Abdominal aortic aneurysm (AAA) screening. You may need this if you are a current or former smoker.  Osteoporosis. You may be screened starting at age 70 if you are at high risk. Talk with your health care provider about your test results, treatment options, and if necessary, the need for more tests. Vaccines  Your health care provider may recommend certain vaccines, such as:  Influenza vaccine. This  is recommended every year.  Tetanus, diphtheria, and acellular pertussis (Tdap, Td) vaccine. You may need a Td booster every 10 years.  Zoster vaccine. You may  need this after age 70.  Pneumococcal 13-valent conjugate (PCV13) vaccine. One dose is recommended after age 71.  Pneumococcal polysaccharide (PPSV23) vaccine. One dose is recommended after age 40. Talk to your health care provider about which screenings and vaccines you need and how often you need them. This information is not intended to replace advice given to you by your health care provider. Make sure you discuss any questions you have with your health care provider. Document Released: 12/14/2015 Document Revised: 08/06/2016 Document Reviewed: 09/18/2015 Elsevier Interactive Patient Education  2017 [employer].  Fall Prevention in the Home Falls can cause injuries. They can happen to people of all ages. There are many things you can do to make your home safe and to help prevent falls. What can I do on the outside of my home?  Regularly fix the edges of walkways and driveways and fix any cracks.  Remove anything that might make you trip as you walk through a door, such as a raised step or threshold.  Trim any bushes or trees on the path to your home.  Use bright outdoor lighting.  Clear any walking paths of anything that might make someone trip, such as rocks or tools.  Regularly check to see if handrails are loose or broken. Make sure that both sides of any steps have handrails.  Any raised decks and porches should have guardrails on the edges.  Have any leaves, snow, or ice cleared regularly.  Use sand or salt on walking paths during winter.  Clean up any spills in your garage right away. This includes oil or grease spills. What can I do in the bathroom?  Use night lights.  Install grab bars by the toilet and in the tub and shower. Do not use towel bars as grab bars.  Use non-skid mats or decals in the tub or shower.  If you need to sit down in the shower, use a plastic, non-slip stool.  Keep the floor dry. Clean up any water that spills on the floor as soon as it  happens.  Remove soap buildup in the tub or shower regularly.  Attach bath mats securely with double-sided non-slip rug tape.  Do not have throw rugs and other things on the floor that can make you trip. What can I do in the bedroom?  Use night lights.  Make sure that you have a light by your bed that is easy to reach.  Do not use any sheets or blankets that are too big for your bed. They should not hang down onto the floor.  Have a firm chair that has side arms. You can use this for support while you get dressed.  Do not have throw rugs and other things on the floor that can make you trip. What can I do in the kitchen?  Clean up any spills right away.  Avoid walking on wet floors.  Keep items that you use a lot in easy-to-reach places.  If you need to reach something above you, use a strong step stool that has a grab bar.  Keep electrical cords out of the way.  Do not use floor polish or wax that makes floors slippery. If you must use wax, use non-skid floor wax.  Do not have throw rugs and other things on the floor that can make you  trip. What can I do with my stairs?  Do not leave any items on the stairs.  Make sure that there are handrails on both sides of the stairs and use them. Fix handrails that are broken or loose. Make sure that handrails are as long as the stairways.  Check any carpeting to make sure that it is firmly attached to the stairs. Fix any carpet that is loose or worn.  Avoid having throw rugs at the top or bottom of the stairs. If you do have throw rugs, attach them to the floor with carpet tape.  Make sure that you have a light switch at the top of the stairs and the bottom of the stairs. If you do not have them, ask someone to add them for you. What else can I do to help prevent falls?  Wear shoes that:  Do not have high heels.  Have rubber bottoms.  Are comfortable and fit you well.  Are closed at the toe. Do not wear sandals.  If you  use a stepladder:  Make sure that it is fully opened. Do not climb a closed stepladder.  Make sure that both sides of the stepladder are locked into place.  Ask someone to hold it for you, if possible.  Clearly mark and make sure that you can see:  Any grab bars or handrails.  First and last steps.  Where the edge of each step is.  Use tools that help you move around (mobility aids) if they are needed. These include:  Canes.  Walkers.  Scooters.  Crutches.  Turn on the lights when you go into a dark area. Replace any light bulbs as soon as they burn out.  Set up your furniture so you have a clear path. Avoid moving your furniture around.  If any of your floors are uneven, fix them.  If there are any pets around you, be aware of where they are.  Review your medicines with your doctor. Some medicines can make you feel dizzy. This can increase your chance of falling. Ask your doctor what other things that you can do to help prevent falls. This information is not intended to replace advice given to you by your health care provider. Make sure you discuss any questions you have with your health care provider. Document Released: 09/13/2009 Document Revised: 04/24/2016 Document Reviewed: 12/22/2014 Elsevier Interactive Patient Education  2017 [employer].

## 2020-11-14 ENCOUNTER — Other Ambulatory Visit: Payer: Self-pay

## 2020-11-15 ENCOUNTER — Encounter: Payer: Self-pay | Admitting: Family

## 2020-11-15 ENCOUNTER — Ambulatory Visit (INDEPENDENT_AMBULATORY_CARE_PROVIDER_SITE_OTHER): Payer: Medicare Other | Admitting: Family

## 2020-11-15 VITALS — BP 138/78 | HR 101 | Temp 97.5°F | Ht 71.0 in | Wt 227.4 lb

## 2020-11-15 DIAGNOSIS — Z23 Encounter for immunization: Secondary | ICD-10-CM | POA: Diagnosis not present

## 2020-11-15 DIAGNOSIS — R200 Anesthesia of skin: Secondary | ICD-10-CM

## 2020-11-15 DIAGNOSIS — R202 Paresthesia of skin: Secondary | ICD-10-CM | POA: Diagnosis not present

## 2020-11-15 MED ORDER — GABAPENTIN 400 MG PO CAPS: 800.0000 mg | ORAL_CAPSULE | Freq: Every day | ORAL | 1 refills | Status: DC

## 2020-11-15 NOTE — Patient Instructions (Signed)
1. Increase Gabapentin to 2 at bedtime 2. Refer to Neurology for Nerve conduction study and evaluation 3. Give me a call Tuesday and let me know if the numbness is improving.   Your physician has ordered a Nerve Conduction Study (NCV) and/or EMG testing.  This is a test to assess the status of your nerves and muscles. For the NCV portion of the test , sticky tabs will be placed on either your hands or feet.  Your nerves will be stimulated using small electrical charges and the speed of the impulse will be measured as it travels down the nerve. For the EMG portion of the test, a small pin will be placed below the surface of the skin to measure the electrical activity in certain muscles in your arms or legs. Eating/drinking prior to testing is ok.  Please DO NOT discontinue ANY medications prior to testing  Your appointment will be scheduled by a member of our team.  You will need to check in with the main reception desk. Your test could take approximately 30 minutes to 1 hour to complete depending on the extent of the test that has been ordered. TESTING ON LEGS/BACK/HIP/FOOT AREA: Bring/wear a pair of shorts.  **ABSOLUTELY NO LOTIONS, MOISTURIZERS, VASELINE, OILS OR CREAMS OF ANY KIND ON ANY BODY PART THE DAY OF THE TEST**  **DEODORANT IS OK TO WEAR**  Please make every effort to keep your scheduled appointment time, as your physician needs the test results prior to your next appointment.  If you have to cancel or reschedule, please do so as soon as possible, so that another patient may use that testing time slot.  If you cancel your appointment, you may also need to reschedule your referring doctor's appointment until the test and results can be completed.  Please call [phone] and ask for Dr. Apal[place]in Blas assistant if you need to do so.  If you have any questions at any time please let us know.  We look forward to working with you!!

## 2020-11-15 NOTE — Progress Notes (Signed)
Acute Office Visit  Subjective:    Patient ID: Paul Burke, male    DOB: July 01, 1951, 69 y.o.   MRN: [id number]  Chief Complaint  Patient presents with  . Follow-up    Follow up on tingling in hands and feet still present.     HPI Patient is in today for a follow-up of tingling n the hands and feet. He was seen last month for similar symptoms and originally suspected the Gabapentin was causing his symptoms. He took a break from the Gabapentin but symptoms actually worsened. The numbness extends from his forearms to the tips of all fingers. His feet burn under the bottom and lower less feel numb at times. Has a history of Gout.   Past Medical History:  Diagnosis Date  . Gout   . Hypertension   . Thyroid disease     History reviewed. No pertinent surgical history.  History reviewed. No pertinent family history.  Social History   Socioeconomic History  . Marital status: Single    Spouse name: Not on file  . Number of children: Not on file  . Years of education: Not on file  . Highest education level: Not on file  Occupational History  . Not on file  Tobacco Use  . Smoking status: Former Games developer  . Smokeless tobacco: Never Used  Substance and Sexual Activity  . Alcohol use: Yes    Comment: occassionally on the weekinds.  . Drug use: Not on file  . Sexual activity: Not on file  Other Topics Concern  . Not on file  Social History Narrative  . Not on file   Social Determinants of Health   Financial Resource Strain: Low Risk   . Difficulty of Paying Living Expenses: Not very hard  Food Insecurity: No Food Insecurity  . Worried About Programme researcher, broadcasting/film/video in the Last Year: Never true  . Ran Out of Food in the Last Year: Never true  Transportation Needs: No Transportation Needs  . Lack of Transportation (Medical): No  . Lack of Transportation (Non-Medical): No  Physical Activity: Inactive  . Days of Exercise per Week: 0 days  . Minutes of Exercise per Session: 0 min   Stress: No Stress Concern Present  . Feeling of Stress : Not at all  Social Connections: Socially Isolated  . Frequency of Communication with Friends and Family: More than three times a week  . Frequency of Social Gatherings with Friends and Family: More than three times a week  . Attends Religious Services: Never  . Active Member of Clubs or Organizations: No  . Attends Banker Meetings: Never  . Marital Status: Never married  Intimate Partner Violence: Not At Risk  . Fear of Current or Ex-Partner: No  . Emotionally Abused: No  . Physically Abused: No  . Sexually Abused: No    Outpatient Medications Prior to Visit  Medication Sig Dispense Refill  . allopurinol (ZYLOPRIM) 100 MG tablet Take 1 tablet (100 mg total) by mouth 2 (two) times daily. 180 tablet 1  . amLODipine (NORVASC) 10 MG tablet TAKE ONE TABLET BY MOUTH EVERY MORNING 90 tablet 1  . aspirin 81 MG chewable tablet Chew 81 mg by mouth daily.     [name] atorvastatin (LIPITOR) 20 MG tablet TAKE 1 TABLET BY MOUTH EVERY DAY 90 tablet 3  . Cholecalciferol 50 MCG (2000 UT) CAPS Take 2,000 Units by mouth daily.    . ferrous sulfate 325 (65 FE) MG EC tablet  Take 325 mg by mouth daily with breakfast.    . gabapentin (NEURONTIN) 400 MG capsule Take 1 capsule (400 mg total) by mouth at bedtime. 90 capsule 2  . levothyroxine (SYNTHROID) 75 MCG tablet Take 1 tablet (75 mcg total) by mouth daily before breakfast. 90 tablet 3  . lisinopril (ZESTRIL) 40 MG tablet TAKE ONE TABLET BY MOUTH EVERY MORNING 90 tablet 1  . meloxicam (MOBIC) 15 MG tablet Take 1 tablet (15 mg total) by mouth daily as needed for pain. 90 tablet 0  . omeprazole (PRILOSEC) 40 MG capsule Take 1 capsule (40 mg total) by mouth daily. 90 capsule 1  . sildenafil (VIAGRA) 100 MG tablet Take 0.5-1 tablets (50-100 mg total) by mouth daily as needed for erectile dysfunction. 40 tablet 3  . vitamin B-12 (CYANOCOBALAMIN) 500 MCG tablet Take 500 mcg by mouth daily.      No facility-administered medications prior to visit.    No Known Allergies  Review of Systems  Constitutional: Negative.   Respiratory: Negative.   Cardiovascular: Negative.   Endocrine: Negative.   Musculoskeletal: Negative.   Skin: Negative.   Allergic/Immunologic: Negative.   Neurological: Positive for numbness.       Hands, lower arms, and feet   Hematological: Negative.   Psychiatric/Behavioral: Negative.   All other systems reviewed and are negative.      Objective:    Physical Exam Vitals reviewed.  Constitutional:      Appearance: Normal appearance.  Cardiovascular:     Rate and Rhythm: Normal rate and regular rhythm.  Pulmonary:     Effort: Pulmonary effort is normal.     Breath sounds: Normal breath sounds.  Musculoskeletal:        General: Normal range of motion.     Cervical back: Normal range of motion.  Skin:    General: Skin is warm and dry.  Neurological:     General: No focal deficit present.     Mental Status: He is alert and oriented to person, place, and time.     Cranial Nerves: No cranial nerve deficit.     Sensory: No sensory deficit.     Motor: No weakness.     Coordination: Coordination normal.     Gait: Gait normal.     Deep Tendon Reflexes: Reflexes normal.     Comments: Negative Tinel, Negative Phalen  Psychiatric:        Mood and Affect: Mood normal.        Behavior: Behavior normal.     BP 138/78   Pulse (!) 101   Temp (!) 97.5 F (36.4 C) (Temporal)   Ht 5\' 11"  (1.803 m)   Wt 227 lb 6.4 oz (103.1 kg)   SpO2 97%   BMI 31.72 kg/m  Wt Readings from Last 3 Encounters:  11/15/20 227 lb 6.4 oz (103.1 kg)  11/13/20 228 lb (103.4 kg)  10/12/20 228 lb 6.4 oz (103.6 kg)    Health Maintenance Due  Topic Date Due  . COVID-19 Vaccine (3 - Booster for Moderna series) 11/03/2020    There are no preventive care reminders to display for this patient.   Lab Results  Component Value Date   TSH 0.535 10/12/2020   Lab  Results  Component Value Date   WBC 7.3 10/12/2020   HGB 11.7 (L) 10/12/2020   HCT 34.8 (L) 10/12/2020   MCV 91 10/12/2020   PLT 195 10/12/2020   Lab Results  Component Value Date   NA 142  10/12/2020   K 4.4 10/12/2020   CO2 19 (L) 10/12/2020   GLUCOSE 118 (H) 10/12/2020   BUN 25 10/12/2020   CREATININE 1.31 (H) 10/12/2020   BILITOT 0.4 10/12/2020   ALKPHOS 72 10/12/2020   AST 29 10/12/2020   ALT 28 10/12/2020   PROT 7.5 10/12/2020   ALBUMIN 4.5 10/12/2020   CALCIUM 9.2 10/12/2020   ANIONGAP 16 (H) 06/27/2020   GFR 61.15 01/17/2020   Lab Results  Component Value Date   CHOL 134 01/17/2020   Lab Results  Component Value Date   HDL 65.50 01/17/2020   Lab Results  Component Value Date   LDLCALC 44 01/17/2020   Lab Results  Component Value Date   TRIG 123.0 01/17/2020   Lab Results  Component Value Date   CHOLHDL 2 01/17/2020   Lab Results  Component Value Date   HGBA1C 5.5 06/22/2020       Assessment & Plan:   Problem List Items Addressed This Visit    Need for influenza vaccination - Primary   Relevant Orders   Flu Vaccine QUAD High Dose(Fluad) (Completed)    Other Visit Diagnoses    Numbness and tingling in both hands       Relevant Orders   Ambulatory referral to Neurology   Numbness and tingling of both feet       Relevant Orders   Ambulatory referral to Neurology        Refer to Neurology for further evaluation. Increase Gabapentin to 2 daily at bedtime.    Eulis Foster, FNP

## 2020-12-13 ENCOUNTER — Ambulatory Visit: Payer: Medicare Other | Admitting: Family Medicine

## 2020-12-14 ENCOUNTER — Other Ambulatory Visit: Payer: Self-pay | Admitting: Family

## 2020-12-14 DIAGNOSIS — M722 Plantar fascial fibromatosis: Secondary | ICD-10-CM

## 2020-12-17 ENCOUNTER — Telehealth: Payer: Self-pay

## 2020-12-17 NOTE — Progress Notes (Addendum)
Chronic Care Management Pharmacy Assistant   Name: Paul Burke  MRN: [id number] DOB: September 29, 1951  Reason for Encounter: Medication Review  Patient Questions:  1.  Have you seen any other providers since your last visit? Yes, 11/15/2020 Family Medicine [facility] Foster  2.  Any changes in your medicines or health? Yes, 11/15/2020 Increase Gabapentin 400 MG Capsule,Take 2 Capsules Daily at bedtime.   PCP : Mliss Sax, MD  Allergies:  No Known Allergies  Medications: Outpatient Encounter Medications as of 12/17/2020  Medication Sig   allopurinol (ZYLOPRIM) 100 MG tablet Take 1 tablet (100 mg total) by mouth 2 (two) times daily.   amLODipine (NORVASC) 10 MG tablet TAKE ONE TABLET BY MOUTH EVERY MORNING   aspirin 81 MG chewable tablet Chew 81 mg by mouth daily.    atorvastatin (LIPITOR) 20 MG tablet TAKE 1 TABLET BY MOUTH EVERY DAY   Cholecalciferol 50 MCG (2000 UT) CAPS Take 2,000 Units by mouth daily.   ferrous sulfate 325 (65 FE) MG EC tablet Take 325 mg by mouth daily with breakfast.   gabapentin (NEURONTIN) 400 MG capsule Take 2 capsules (800 mg total) by mouth at bedtime.   levothyroxine (SYNTHROID) 75 MCG tablet Take 1 tablet (75 mcg total) by mouth daily before breakfast.   lisinopril (ZESTRIL) 40 MG tablet TAKE ONE TABLET BY MOUTH EVERY MORNING   meloxicam (MOBIC) 15 MG tablet Take 1 tablet (15 mg total) by mouth daily as needed for pain.   omeprazole (PRILOSEC) 40 MG capsule Take 1 capsule (40 mg total) by mouth daily.   sildenafil (VIAGRA) 100 MG tablet Take 0.5-1 tablets (50-100 mg total) by mouth daily as needed for erectile dysfunction.   vitamin B-12 (CYANOCOBALAMIN) 500 MCG tablet Take 500 mcg by mouth daily.   No facility-administered encounter medications on file as of 12/17/2020.    Current Diagnosis: Patient Active Problem List   Diagnosis Date Noted   Paresthesias 06/22/2020   B12 deficiency 04/14/2020   Erectile dysfunction due to arterial  insufficiency 04/13/2020   History of gout 01/12/2020   Iron deficiency 01/12/2020   Pain of right hip joint 10/05/2018   Plantar fasciitis of right foot 07/29/2018   Elevated glucose 03/11/2018   Tachycardia 12/22/2017   Anemia 12/22/2017   Ear pain, left 12/22/2017   Drinks alcohol 12/22/2017   Elevated pulse rate 11/20/2017   Healthcare maintenance 11/20/2017   Need for Tdap vaccination 11/20/2017   Need for influenza vaccination 11/20/2017   History of hepatitis C 11/20/2017   Traumatic closed fracture of distal clavicle with minimal displacement, left, with routine healing, subsequent encounter 12/10/2016   Mixed hyperlipidemia 10/02/2015   Thyroid disease 10/02/2015   Essential hypertension 03/06/2015   Gastroesophageal reflux disease 03/06/2015   Gout without tophus 03/06/2015    Goals Addressed   None    Reviewed chart for medication changes ahead of medication coordination call.  OVs, Consults, or hospital visits since last care coordination call/Pharmacist visit.    11/15/2020 Family Medicine Eul[facility] Foster  Medication changes indicated   11/15/2020 Eulis Foster Increase Gabapentin 400 MG Capsule,Take 2 Capsules Daily at bedtime. BP Readings from Last 3 Encounters:  11/15/20 138/78  10/12/20 124/76  06/27/20 114/68    Lab Results  Component Value Date   HGBA1C 5.5 06/22/2020     Patient obtains medications through Vials  90 Days   Last adherence delivery included:     Amlodipine 10 mg tablet Daily Breakfast  Levothyroxine 75 MCG tablet Daily before Breakfast              Lisinopril 40 MG tablet Daily breakfast               Allopurinol 100 mg tablet BID Breakfast, Bedtime    Coordinated acute fill for (med) to be delivered (date).             Gabapentin 400 MG  Capsule Daily Bedtime to be deliver on 11/12/2020               Meloxicam 15 mg tablet PRN - Vial to be deliver on 11/12/2020        Patient declined medication  last month due:      Atorvastatin 20 mg tablet Daily Breakfast (adequate Supply, patient states he received 90DS from [facility])              Ferrous sulfate 325 MG  tablet Daily Breakfast (OTC)              Sildenafil 100 mg tablet PRN - Vial (adequate supply)             Vitamin B-12 500 Mcg Tablet Daily Breakfast (OTC)             Aspirin 81 mg tablet Daily Breakfast (OTC, Adequate supply)             Vitamin D 2000 units daily (OTC, Adequate supply)              Meclizine 25 Tablet (OTC)             Omeprazole 40 mg capsule Daily Breakfast (Adequate Supply)  Patient is due for next adherence delivery on: 12/21/2020. Called patient and reviewed medications and coordinated delivery.  This delivery to include:No Fill Needed   Patient will not  need a short fill of, prior to adherence delivery.   Patient will not need acute fill of medication, prior to adherence delivery.  Patient declined the following medications:    Amlodipine 10 mg tablet Daily Breakfast  (adequate supply)  Levothyroxine 75 MCG tablet Daily before Breakfast  (adequate supply)  Lisinopril 40 MG tablet Daily breakfast   (adequate supply)  Allopurinol 100 mg tablet BID Breakfast, Bedtime  (adequate supply)   Gabapentin 400 MG  Capsule Daily Bedtime  (adequate supply)  Meloxicam 15 mg tablet PRN     Atorvastatin 20 mg tablet Daily Breakfast (adequate Supply, patient states he received 90DS from [facility] on 10/2020 )              Ferrous sulfate 325 MG  tablet Daily Breakfast (OTC)              Sildenafil 100 mg tablet PRN - Vial (adequate supply)             Vitamin B-12 500 Mcg Tablet Daily Breakfast (OTC)             Aspirin 81 mg tablet Daily Breakfast (OTC, Adequate supply)             Vitamin D 2000 units daily (OTC, Adequate supply)              Meclizine 25 Tablet (OTC)             Omeprazole 40 mg capsule Daily Breakfast (Adequate Supply)  Patient needs refills for None ID .  Confirmed delivery date of No Fill Needed, advised patient  that pharmacy will contact them the morning of delivery.  Follow-Up:  Pharmacist Review   Everlean Cherry Clinical Pharmacist Assistant [phone]   5 minutes spent in review, coordination, and documentation. Garey Ham Clinical Pharmacist [facility]  [phone]

## 2021-01-02 ENCOUNTER — Ambulatory Visit: Payer: Medicare Other | Admitting: Family Medicine

## 2021-01-08 NOTE — Progress Notes (Signed)
[facility]    Provider:  Dr Jaynee Eagles Requesting Provider: Kennyth Arnold, FNP Primary Care Provider:  Libby Maw, MD  CC:  Evaluate for emg/ncs  HPI:  ARKEL CARTWRIGHT is a 70 y.o. male here as requested by Kennyth Arnold, FNP for numbness and tingling.  Past medical history gout, hypertension, thyroid disease.  He is also on vitamin B12 daily, possibly for deficiency as B12 was 251 in May 2021 lab.  Symptoms started in his feet in July. Now in the hands and feet. Started with the whole bottom of the feet and the toes, continuous, Gabapentin helps, Stable. Movement of his toes he feels stiffness and numbness, ot currently burning but numbness and tingling, No pain when walking but his feet get so numb he can't feel it and his balance is off. He JUST started taking B12 about 4 months ago.No weakness. Hand symptoms started a few months later maybe end of summer and all the tips of the fingers, symmetrical in the feet and hands, it tingles and moving it around helps, when it is cold the tips of his fingers get cold, not changing colors, same all day long, nothing seems to make it worse, he can feel it tingling more if he reaches or moves a different way, no neck pain, no low back pain, he has imbalance he can't feel his feet, difficulty walking and he has to walk downstairs has to hold onto something not to fall. No new medications at onset, no inciting events. He did fall before the symptoms started his legs gave away and he fell but unclear if associated probably not. Tingling and numbness is not painful, stable not progressing, he stopped smoking 35 years ago, he drinks mostly on the weekends 5-6 beers over the weekend.   I reviewed Mellody Dance notes: Patient was initially seen in November 2019 for numbness and tingling in hands and feet for 1 month, he reported waxing and waning in intensity but never completely going away, he started gabapentin in July (that appears  more like 3 to 4 months prior, atleast?) which helped the burning sensation in his feet, taking it every other day now, no pain, no swelling, CBC, TSH and c-Met were checked.  He was seen again in December, 16th 2021 for tingling in the hands and feet, he took a break from the gabapentin but symptoms worsened, the numbness extends from his forearm to the tips of all fingers, his feet burning to the bottom and feel numb at times, he has a history of gout.  Gabapentin was increased.  Reviewed notes, labs and imaging from outside physicians, which showed:  Vitamin B12 in May 2020 251, RPR and HIV - July 2021, hemoglobin A1c was 6.7 18-month ago, 5.5 668-monthago. 11/12/201: TSH normal, cbc with anemia hgb 11.8,  cmp creat 1.31, bun 25,   Review of Systems: Patient complains of symptoms per HPI as well as the following symptoms:numbnes and tingling. Pertinent negatives and positives per HPI. All others negative.   Social History   Socioeconomic History  . Marital status: Single    Spouse name: Not on file  . Number of children: Not on file  . Years of education: Not on file  . Highest education level: 11th grade  Occupational History  . Not on file  Tobacco Use  . Smoking status: Former Smoker    Types: Cigarettes    Quit date: 1986    Years since quitting: 36.1  .  Smokeless tobacco: Never Used  Vaping Use  . Vaping Use: Never used  Substance and Sexual Activity  . Alcohol use: Yes    Alcohol/week: 5.0 - 6.0 standard drinks    Types: 5 - 6 Cans of beer per week    Comment: "weekends I drink 5-6 beers"  . Drug use: Never  . Sexual activity: Not on file  Other Topics Concern  . Not on file  Social History Narrative   Lives at home alone   Right handed   Caffeine: "very seldom"   Social Determinants of Health   Financial Resource Strain: Low Risk   . Difficulty of Paying Living Expenses: Not very hard  Food Insecurity: No Food Insecurity  . Worried About Charity fundraiser  in the Last Year: Never true  . Ran Out of Food in the Last Year: Never true  Transportation Needs: No Transportation Needs  . Lack of Transportation (Medical): No  . Lack of Transportation (Non-Medical): No  Physical Activity: Inactive  . Days of Exercise per Week: 0 days  . Minutes of Exercise per Session: 0 min  Stress: No Stress Concern Present  . Feeling of Stress : Not at all  Social Connections: Socially Isolated  . Frequency of Communication with Friends and Family: More than three times a week  . Frequency of Social Gatherings with Friends and Family: More than three times a week  . Attends Religious Services: Never  . Active Member of Clubs or Organizations: No  . Attends Archivist Meetings: Never  . Marital Status: Never married  Intimate Partner Violence: Not At Risk  . Fear of Current or Ex-Partner: No  . Emotionally Abused: No  . Physically Abused: No  . Sexually Abused: No    Family History  Problem Relation Age of Onset  . Neuropathy Neg Hx     Past Medical History:  Diagnosis Date  . Gout   . Hypertension   . Thyroid disease     Patient Active Problem List   Diagnosis Date Noted  . Paresthesias 06/22/2020  . B12 deficiency 04/14/2020  . Erectile dysfunction due to arterial insufficiency 04/13/2020  . History of gout 01/12/2020  . Iron deficiency 01/12/2020  . Pain of right hip joint 10/05/2018  . Plantar fasciitis of right foot 07/29/2018  . Elevated glucose 03/11/2018  . Tachycardia 12/22/2017  . Anemia 12/22/2017  . Ear pain, left 12/22/2017  . Drinks alcohol 12/22/2017  . Elevated pulse rate 11/20/2017  . Healthcare maintenance 11/20/2017  . Need for Tdap vaccination 11/20/2017  . Need for influenza vaccination 11/20/2017  . History of hepatitis C 11/20/2017  . Traumatic closed fracture of distal clavicle with minimal displacement, left, with routine healing, subsequent encounter 12/10/2016  . Mixed hyperlipidemia 10/02/2015  .  Thyroid disease 10/02/2015  . Essential hypertension 03/06/2015  . Gastroesophageal reflux disease 03/06/2015  . Gout without tophus 03/06/2015    Past Surgical History:  Procedure Laterality Date  . NO PAST SURGERIES      Current Outpatient Medications  Medication Sig Dispense Refill  . allopurinol (ZYLOPRIM) 100 MG tablet Take 1 tablet (100 mg total) by mouth 2 (two) times daily. 180 tablet 1  . amLODipine (NORVASC) 10 MG tablet TAKE ONE TABLET BY MOUTH EVERY MORNING 90 tablet 1  . aspirin 81 MG chewable tablet Chew 81 mg by mouth daily.     [name] atorvastatin (LIPITOR) 20 MG tablet TAKE 1 TABLET BY MOUTH EVERY DAY 90 tablet 3  .  Cholecalciferol 50 MCG (2000 UT) CAPS Take 2,000 Units by mouth daily.    . ferrous sulfate 325 (65 FE) MG EC tablet Take 325 mg by mouth daily with breakfast.    . levothyroxine (SYNTHROID) 75 MCG tablet Take 1 tablet (75 mcg total) by mouth daily before breakfast. 90 tablet 3  . lisinopril (ZESTRIL) 40 MG tablet TAKE ONE TABLET BY MOUTH EVERY MORNING 90 tablet 1  . meloxicam (MOBIC) 15 MG tablet TAKE ONE TABLET BY MOUTH DAILY AS NEEDED FOR PAIN 13 tablet 0  . omeprazole (PRILOSEC) 40 MG capsule Take 1 capsule (40 mg total) by mouth daily. 90 capsule 1  . OVER THE COUNTER MEDICATION Slow release iron supplement    . sildenafil (VIAGRA) 100 MG tablet Take 0.5-1 tablets (50-100 mg total) by mouth daily as needed for erectile dysfunction. 40 tablet 3  . vitamin B-12 (CYANOCOBALAMIN) 500 MCG tablet Take 500 mcg by mouth daily.    [name] gabapentin (NEURONTIN) 400 MG capsule Take 1-2 capsules (400-800 mg total) by mouth 3 (three) times daily. 540 capsule 3   No current facility-administered medications for this visit.    Allergies as of 01/09/2021  . (No Known Allergies)    Vitals: BP 138/83 (BP Location: Left Arm, Patient Position: Sitting)   Pulse 100   Ht 6' (1.829 m)   Wt 234 lb (106.1 kg)   BMI 31.74 kg/m  Last Weight:  Wt Readings from Last 1  Encounters:  01/09/21 234 lb (106.1 kg)   Last Height:   Ht Readings from Last 1 Encounters:  01/09/21 6' (1.829 m)     Physical exam: Exam: Gen: NAD, conversant, well nourised, obese, well groomed                     CV: RRR, no MRG. No Carotid Bruits. No peripheral edema, warm, nontender Eyes: Conjunctivae clear without exudates or hemorrhage  Neuro: Detailed Neurologic Exam  Speech:    Speech is normal; fluent and spontaneous with normal comprehension.  Cognition:    The patient is oriented to person, place, and time;     recent and remote memory intact;     language fluent;     normal attention, concentration,     fund of knowledge Cranial Nerves:    The pupils are equal, round, and reactive to light. Pupils too small to visualize fundi. Visual fields are full to finger confrontation. Extraocular movements are intact. Trigeminal sensation is intact and the muscles of mastication are normal. The face is symmetric. The palate elevates in the midline. Hearing intact. Voice is normal. Shoulder shrug is normal. The tongue has normal motion without fasciculations.   Coordination:    Normal finger to nose and heel to shin.   Gait:    Heel-toe and tandem gait are normal.   Motor Observation:    No asymmetry, no atrophy, and no involuntary movements noted. Tone:    Normal muscle tone.    Posture:    Posture is normal. normal erect    Strength:    Strength is V/V in the upper and lower limbs.      Sensation: intact to LT     Reflex Exam:  DTR's: Absent AJs otherwise deep tendon reflexes in the upper and lower extremities are normal bilaterally.   Toes:    The toes are downgoing bilaterally.   Clonus:    Clonus is absent.    Assessment/Plan:  70 year old with numbness and tingling  in the hands and feet will evaluate as below, increase Gabapentin.  Orders Placed This Encounter  Procedures  . Vitamin B1  . Vitamin B6  . B12 and Folate Panel  . Methylmalonic  acid, serum  . Hemoglobin A1c  . Sjogren's syndrome antibods(ssa + ssb)  . Rheumatoid factor  . ANA, IFA (with reflex)  . TSH  . Angiotensin converting enzyme  . Sedimentation rate  . Pan-ANCA  . Heavy metals, blood  . Multiple Myeloma Panel (SPEP&IFE w/QIG)  . Cryoglobulin  . NCV with EMG(electromyography)   Meds ordered this encounter  Medications  . gabapentin (NEURONTIN) 400 MG capsule    Sig: Take 1-2 capsules (400-800 mg total) by mouth 3 (three) times daily.    Dispense:  540 capsule    Refill:  3    Cc: Kennyth Arnold, FNP,  Libby Maw, MD  Sarina Ill, MD  [facility] [address] [place], [place] [address]  Phone [phone] Fax [phone]

## 2021-01-09 ENCOUNTER — Ambulatory Visit (INDEPENDENT_AMBULATORY_CARE_PROVIDER_SITE_OTHER): Payer: Medicare Other | Admitting: Neurology

## 2021-01-09 ENCOUNTER — Encounter: Payer: Self-pay | Admitting: Neurology

## 2021-01-09 VITALS — BP 138/83 | HR 100 | Ht 72.0 in | Wt 234.0 lb

## 2021-01-09 DIAGNOSIS — R200 Anesthesia of skin: Secondary | ICD-10-CM | POA: Diagnosis not present

## 2021-01-09 DIAGNOSIS — D649 Anemia, unspecified: Secondary | ICD-10-CM | POA: Diagnosis not present

## 2021-01-09 DIAGNOSIS — R6889 Other general symptoms and signs: Secondary | ICD-10-CM | POA: Diagnosis not present

## 2021-01-09 DIAGNOSIS — R209 Unspecified disturbances of skin sensation: Secondary | ICD-10-CM | POA: Diagnosis not present

## 2021-01-09 DIAGNOSIS — R202 Paresthesia of skin: Secondary | ICD-10-CM | POA: Diagnosis not present

## 2021-01-09 MED ORDER — GABAPENTIN 400 MG PO CAPS: 400.0000 mg | ORAL_CAPSULE | Freq: Three times a day (TID) | ORAL | 3 refills | Status: DC

## 2021-01-09 NOTE — Patient Instructions (Addendum)
Elige Radon and Daroff's neurology in clinical practice (8th ed., pp. [phone]- [phone]). Elsevier."> Goldman-Cecil medicine (26th ed., pp. 2489- 2501). Elsevier.">  Peripheral Neuropathy Peripheral neuropathy is a type of nerve damage. It affects nerves that carry signals between the spinal cord and the arms, legs, and the rest of the body (peripheral nerves). It does not affect nerves in the spinal cord or brain. In peripheral neuropathy, one nerve or a group of nerves may be damaged. Peripheral neuropathy is a broad category that includes many specific nerve disorders, like diabetic neuropathy, hereditary neuropathy, and carpal tunnel syndrome. What are the causes? This condition may be caused by:  Diabetes. This is the most common cause of peripheral neuropathy.  Nerve injury.  Pressure or stress on a nerve that lasts a long time.  Lack (deficiency) of B vitamins. This can result from alcoholism, poor diet, or a restricted diet.  Infections.  Autoimmune diseases, such as rheumatoid arthritis and systemic lupus erythematosus.  Nerve diseases that are passed from parent to child (inherited).  Some medicines, such as cancer medicines (chemotherapy).  Poisonous (toxic) substances, such as lead and mercury.  Too little blood flowing to the legs.  Kidney disease.  Thyroid disease. In some cases, the cause of this condition is not known. What are the signs or symptoms? Symptoms of this condition depend on which of your nerves is damaged. Common symptoms include:  Loss of feeling (numbness) in the feet, hands, or both.  Tingling in the feet, hands, or both.  Burning pain.  Very sensitive skin.  Weakness.  Not being able to move a part of the body (paralysis).  Muscle twitching.  Clumsiness or poor coordination.  Loss of balance.  Not being able to control your bladder.  Feeling dizzy.  Sexual problems. How is this diagnosed? Diagnosing and finding the cause of peripheral  neuropathy can be difficult. Your health care provider will take your medical history and do a physical exam. A neurological exam will also be done. This involves checking things that are affected by your brain, spinal cord, and nerves (nervous system). For example, your health care provider will check your reflexes, how you move, and what you can feel. You may have other tests, such as:  Blood tests.  Electromyogram (EMG) and nerve conduction tests. These tests check nerve function and how well the nerves are controlling the muscles.  Imaging tests, such as CT scans or MRI to rule out other causes of your symptoms.  Removing a small piece of nerve to be examined in a lab (nerve biopsy).  Removing and examining a small amount of the fluid that surrounds the brain and spinal cord (lumbar puncture). How is this treated? Treatment for this condition may involve:  Treating the underlying cause of the neuropathy, such as diabetes, kidney disease, or vitamin deficiencies.  Stopping medicines that can cause neuropathy, such as chemotherapy.  Medicine to help relieve pain. Medicines may include: ? Prescription or over-the-counter pain medicine. ? Antiseizure medicine. ? Antidepressants. ? Pain-relieving patches that are applied to painful areas of skin.  Surgery to relieve pressure on a nerve or to destroy a nerve that is causing pain.  Physical therapy to help improve movement and balance.  Devices to help you move around (assistive devices). Follow these instructions at home: Medicines  Take over-the-counter and prescription medicines only as told by your health care provider. Do not take any other medicines without first asking your health care provider.  Do not drive or use  heavy machinery while taking prescription pain medicine. Lifestyle  Do not use any products that contain nicotine or tobacco, such as cigarettes and e-cigarettes. Smoking keeps blood from reaching damaged nerves.  If you need help quitting, ask your health care provider.  Avoid or limit alcohol. Too much alcohol can cause a vitamin B deficiency, and vitamin B is needed for healthy nerves.  Eat a healthy diet. This includes: ? Eating foods that are high in fiber, such as fresh fruits and vegetables, whole grains, and beans. ? Limiting foods that are high in fat and processed sugars, such as fried or sweet foods.   General instructions  If you have diabetes, work closely with your health care provider to keep your blood sugar under control.  If you have numbness in your feet: ? Check every day for signs of injury or infection. Watch for redness, warmth, and swelling. ? Wear padded socks and comfortable shoes. These help protect your feet.  Develop a good support system. Living with peripheral neuropathy can be stressful. Consider talking with a mental health specialist or joining a support group.  Use assistive devices and attend physical therapy as told by your health care provider. This may include using a walker or a cane.  Keep all follow-up visits as told by your health care provider. This is important.   Contact a health care provider if:  You have new signs or symptoms of peripheral neuropathy.  You are struggling emotionally from dealing with peripheral neuropathy.  Your pain is not well-controlled. Get help right away if:  You have an injury or infection that is not healing normally.  You develop new weakness in an arm or leg.  You have fallen or do so frequently. Summary  Peripheral neuropathy is when the nerves in the arms, or legs are damaged, resulting in numbness, weakness, or pain.  There are many causes of peripheral neuropathy, including diabetes, pinched nerves, vitamin deficiencies, autoimmune disease, and hereditary conditions.  Diagnosing and finding the cause of peripheral neuropathy can be difficult. Your health care provider will take your medical history, do a  physical exam, and do tests, including blood tests and nerve function tests.  Treatment involves treating the underlying cause of the neuropathy and taking medicines to help control pain. Physical therapy and assistive devices may also help. This information is not intended to replace advice given to you by your health care provider. Make sure you discuss any questions you have with your health care provider. Document Revised: 08/28/2020 Document Reviewed: 08/28/2020 Elsevier Patient Education  2021 Elsevier Inc.   Electromyoneurogram Electromyoneurogram is a test to check how well your muscles and nerves are working. This procedure includes the combined use of electromyogram (EMG) and nerve conduction study (NCS). EMG is used to look for muscular disorders. NCS, which is also called electroneurogram, measures how well your nerves are controlling your muscles. The procedures are usually done together to check if your muscles and nerves are healthy. If the results of the tests are abnormal, this may indicate disease or injury, such as a neuromuscular disease or peripheral nerve damage. Tell a health care provider about:  Any allergies you have.  All medicines you are taking, including vitamins, herbs, eye drops, creams, and over-the-counter medicines.  Any problems you or family members have had with anesthetic medicines.  Any blood disorders you have.  Any surgeries you have had.  Any medical conditions you have.  If you have a pacemaker.  Whether you are pregnant  or may be pregnant. What are the risks? Generally, this is a safe procedure. However, problems may occur, including:  Infection where the electrodes were inserted.  Bleeding. What happens before the procedure? Medicines Ask your health care provider about:  Changing or stopping your regular medicines. This is especially important if you are taking diabetes medicines or blood thinners.  Taking medicines such as  aspirin and ibuprofen. These medicines can thin your blood. Do not take these medicines unless your health care provider tells you to take them.  Taking over-the-counter medicines, vitamins, herbs, and supplements. General instructions  Your health care provider may ask you to avoid: ? Beverages that have caffeine, such as coffee and tea. ? Any products that contain nicotine or tobacco. These products include cigarettes, e-cigarettes, and chewing tobacco. If you need help quitting, ask your health care provider.  Do not use lotions or creams on the same day that you will be having the procedure. What happens during the procedure? For EMG  Your health care provider will ask you to stay in a position so that he or she can access the muscle that will be studied. You may be standing, sitting, or lying down.  You may be given a medicine that numbs the area (local anesthetic).  A very thin needle that has an electrode will be inserted into your muscle.  Another small electrode will be placed on your skin near the muscle.  Your health care provider will ask you to continue to remain still.  The electrodes will send a signal that tells about the electrical activity of your muscles. You may see this on a monitor or hear it in the room.  After your muscles have been studied at rest, your health care provider will ask you to contract or flex your muscles. The electrodes will send a signal that tells about the electrical activity of your muscles.  Your health care provider will remove the electrodes and the electrode needles when the procedure is finished. The procedure may vary among health care providers and hospitals.   For NCS  An electrode that records your nerve activity (recording electrode) will be placed on your skin by the muscle that is being studied.  An electrode that is used as a reference (reference electrode) will be placed near the recording electrode.  A paste or gel will be  applied to your skin between the recording electrode and the reference electrode.  Your nerve will be stimulated with a mild shock. Your health care provider will measure how much time it takes for your muscle to react.  Your health care provider will remove the electrodes and the gel when the procedure is finished. The procedure may vary among health care providers and hospitals.   What happens after the procedure?  It is up to you to get the results of your procedure. Ask your health care provider, or the department that is doing the procedure, when your results will be ready.  Your health care provider may: ? Give you medicines for any pain. ? Monitor the insertion sites to make sure that bleeding stops. Summary  Electromyoneurogram is a test to check how well your muscles and nerves are working.  If the results of the tests are abnormal, this may indicate disease or injury.  This is a safe procedure. However, problems may occur, such as bleeding and infection.  Your health care provider will do two tests to complete this procedure. One checks your muscles (EMG) and another  checks your nerves (NCS).  It is up to you to get the results of your procedure. Ask your health care provider, or the department that is doing the procedure, when your results will be ready. This information is not intended to replace advice given to you by your health care provider. Make sure you discuss any questions you have with your health care provider. Document Revised: 08/03/2018 Document Reviewed: 07/16/2018 Elsevier Patient Education  2021 [employer].

## 2021-01-14 ENCOUNTER — Telehealth: Payer: Self-pay | Admitting: *Deleted

## 2021-01-14 NOTE — Telephone Encounter (Signed)
Spoke with patient and discussed lab results per Dr Lucia Gaskins. Pt was advised of the entire message. He verbalized understanding of the results. Pt reminded of his upcoming 2/17 EMG/NCS at 9:15 AM. He verbalized appreciation for the call.

## 2021-01-14 NOTE — Telephone Encounter (Signed)
-----   Message from Anson Fret, MD sent at 01/13/2021 10:26 AM EST ----- So far blood work looks fine. He is, however, slightly pre-diabetic and even pre-diabetes can cause nerve problems in the feet however his HgbA1c is so minimally pre-diabetic I really don;t think this is the cause. Still awaiting more blood work and we will call him if anything come back abnormal.Will also see him for emg/ncs thanks.

## 2021-01-16 ENCOUNTER — Telehealth: Payer: Self-pay

## 2021-01-16 NOTE — Progress Notes (Signed)
Chronic Care Management Pharmacy Assistant   Name: Paul Burke  MRN: [id number] DOB: 03/08/1951  Reason for Encounter: Medication Review  Patient Questions:  1.  Have you seen any other providers since your last visit? Yes, 01/09/2021 Neurology Naomie Dean  2.  Any changes in your medicines or health? Yes, 01/09/2021 Neurology Desma Maxim Adhern increase Gabapentin 1-2 capsule three times daily    PCP : Mliss Sax, MD  Allergies:  No Known Allergies  Medications: Outpatient Encounter Medications as of 01/16/2021  Medication Sig  . allopurinol (ZYLOPRIM) 100 MG tablet Take 1 tablet (100 mg total) by mouth 2 (two) times daily.  [name] amLODipine (NORVASC) 10 MG tablet TAKE ONE TABLET BY MOUTH EVERY MORNING  . aspirin 81 MG chewable tablet Chew 81 mg by mouth daily.   [name] atorvastatin (LIPITOR) 20 MG tablet TAKE 1 TABLET BY MOUTH EVERY DAY  . Cholecalciferol 50 MCG (2000 UT) CAPS Take 2,000 Units by mouth daily.  . ferrous sulfate 325 (65 FE) MG EC tablet Take 325 mg by mouth daily with breakfast.  . gabapentin (NEURONTIN) 400 MG capsule Take 1-2 capsules (400-800 mg total) by mouth 3 (three) times daily.  [name] levothyroxine (SYNTHROID) 75 MCG tablet Take 1 tablet (75 mcg total) by mouth daily before breakfast.  . lisinopril (ZESTRIL) 40 MG tablet TAKE ONE TABLET BY MOUTH EVERY MORNING  . meloxicam (MOBIC) 15 MG tablet TAKE ONE TABLET BY MOUTH DAILY AS NEEDED FOR PAIN  . omeprazole (PRILOSEC) 40 MG capsule Take 1 capsule (40 mg total) by mouth daily.  [name] OVER THE COUNTER MEDICATION Slow release iron supplement  . sildenafil (VIAGRA) 100 MG tablet Take 0.5-1 tablets (50-100 mg total) by mouth daily as needed for erectile dysfunction.  . vitamin B-12 (CYANOCOBALAMIN) 500 MCG tablet Take 500 mcg by mouth daily.   No facility-administered encounter medications on file as of 01/16/2021.    Current Diagnosis: Patient Active Problem List   Diagnosis Date Noted  . Paresthesias  06/22/2020  . B12 deficiency 04/14/2020  . Erectile dysfunction due to arterial insufficiency 04/13/2020  . History of gout 01/12/2020  . Iron deficiency 01/12/2020  . Pain of right hip joint 10/05/2018  . Plantar fasciitis of right foot 07/29/2018  . Elevated glucose 03/11/2018  . Tachycardia 12/22/2017  . Anemia 12/22/2017  . Ear pain, left 12/22/2017  . Drinks alcohol 12/22/2017  . Elevated pulse rate 11/20/2017  . Healthcare maintenance 11/20/2017  . Need for Tdap vaccination 11/20/2017  . Need for influenza vaccination 11/20/2017  . History of hepatitis C 11/20/2017  . Traumatic closed fracture of distal clavicle with minimal displacement, left, with routine healing, subsequent encounter 12/10/2016  . Mixed hyperlipidemia 10/02/2015  . Thyroid disease 10/02/2015  . Essential hypertension 03/06/2015  . Gastroesophageal reflux disease 03/06/2015  . Gout without tophus 03/06/2015    Goals Addressed   None    Reviewed chart for medication changes ahead of medication coordination call.  OVs, Consults, or hospital visits since last care coordination call/Pharmacist visit.  Yes, 01/09/2021 Neurology Naomie Dean  Medication changes indicated:  Yes, 01/09/2021 Neurology Desma Maxim Adhern increase Gabapentin 1-2 capsule three times daily PRN   BP Readings from Last 3 Encounters:  01/09/21 138/83  11/15/20 138/78  10/12/20 124/76    Lab Results  Component Value Date   HGBA1C 5.8 (H) 01/09/2021     Patient obtains medications through Vials  90 Days   Last adherence delivery included:   None ID Patient declined  medication last month:  Amlodipine 10 mg tablet Daily Breakfast(adequate supply)             Levothyroxine 75 MCG tablet Daily before Breakfast (adequate supply)             Lisinopril 40 MG tablet Daily breakfast (adequate supply)             Allopurinol 100 mg tablet BID Breakfast, Bedtime(adequate supply)             Gabapentin 400 MG Capsule Daily  Bedtime(adequate supply)             Meloxicam15mg  tablet PRN             Atorvastatin 20 mg tablet Daily Breakfast(adequate Supply, patient states he received 90DS from [facility] on 10/2020 ) Ferrous sulfate 325 MG tablet Daily Breakfast(OTC) Sildenafil 100 mg tablet PRN- Vial(adequate supply) Vitamin B-12 500 Mcg TabletDaily Breakfast(OTC) Aspirin 81 mg tablet Daily Breakfast (OTC, Adequate supply) Vitamin D 2000 units daily (OTC, Adequate supply) Meclizine 25 Tablet (OTC) Omeprazole 40 mg capsule Daily Breakfast(Adequate Supply)  Patient is due for next adherence delivery on: 01/23/2021. Called patient and reviewed medications and coordinated delivery.   This delivery to include:  None ID Patient will  Not need a short fill of medication, prior to adherence delivery.   Patient will not need a acute fill of medication, prior to adherence delivery.  Patient declined the following medications:  Amlodipine 10 mg tablet Daily Breakfast(adequate supply)             Levothyroxine 75 MCG tablet Daily before Breakfast (adequate supply)             Lisinopril 40 MG tablet Daily breakfast (adequate supply)             Allopurinol 100 mg tablet BID Breakfast, Bedtime(adequate supply)             Gabapentin 400 MG Capsule Daily Bedtime(adequate supply)             Meloxicam15mg  tablet PRN             Atorvastatin 20 mg tablet Daily Breakfast(adequate Supply) Ferrous sulfate 325 MG tablet Daily Breakfast(OTC) Sildenafil 100 mg tablet PRN- Vial(adequate supply) Vitamin B-12 500 Mcg TabletDaily Breakfast(OTC) Aspirin 81 mg tablet Daily Breakfast (OTC, Adequate supply) Vitamin D 2000 units daily (OTC, Adequate supply) Meclizine 25 Tablet (OTC) Omeprazole 40 mg capsule Daily Breakfast(Adequate  Supply)  Patient needs refills for None ID.  Confirmed delivery date of No Fill Needed, advised patient that pharmacy will contact them the morning of delivery.  Follow-Up:  Pharmacist Review   Everlean Cherry Clinical Pharmacist Assistant [phone]

## 2021-01-17 ENCOUNTER — Ambulatory Visit (INDEPENDENT_AMBULATORY_CARE_PROVIDER_SITE_OTHER): Payer: Medicare Other | Admitting: Neurology

## 2021-01-17 ENCOUNTER — Other Ambulatory Visit: Payer: Self-pay

## 2021-01-17 DIAGNOSIS — G5603 Carpal tunnel syndrome, bilateral upper limbs: Secondary | ICD-10-CM

## 2021-01-17 DIAGNOSIS — R202 Paresthesia of skin: Secondary | ICD-10-CM

## 2021-01-17 DIAGNOSIS — R200 Anesthesia of skin: Secondary | ICD-10-CM

## 2021-01-17 DIAGNOSIS — E538 Deficiency of other specified B group vitamins: Secondary | ICD-10-CM

## 2021-01-17 DIAGNOSIS — G629 Polyneuropathy, unspecified: Secondary | ICD-10-CM

## 2021-01-17 DIAGNOSIS — Z0289 Encounter for other administrative examinations: Secondary | ICD-10-CM

## 2021-01-17 NOTE — Progress Notes (Signed)
Full Name: Paul Burke Gender: Male MRN #: [id number] Date of Birth: [birth year]    Visit Date: 01/17/2021 09:15 Age: 70 Years Examining Physician: Naomie Dean, MD  Requesting Provider: Eulis Foster, FNP Primary Care Provider:  Mliss Sax, MD    History:  70 year old with numbness and tingling in the hands and feet   Summary: NCS performed on the bilateral lower and right upper extremities. EMG was performed on the right upper and right lower extremities.  The right median orthodromic sensory nerve showed delayed distal peak latency (4.1 ms, normal less than 3.4) and diminished amplitude (4 V, normal greater than 10).  The right median/ulnar (palm) comparison nerve showed prolonged distal peak latency (Median Palm, 2.7 ms, N<2.2) and abnormal peak latency difference (Median Palm-Ulnar Palm, 0.5 ms, N<0.4) with a relative median delay.  The right tibial F wave showed delayed latency (63.7 ms, normal less than 56) and the left tibial F wave showed delayed latency (66.5 ms, normal less than 56) likely due to long limbs. All remaining nerves (as indicated in the following tables) were within normal limits.  All muscles (as indicated in the following tables) were within normal limits.    Conclusion: 1. There is mild carpal tunnel syndrome. 2. EMG/NCS of the lower extremities was normal. No suggestion of large-fiber polyneuropathy. However a small-fiber polyneuropathy may evade detection by this exam.    ------------------------------- Naomie Dean M.D.  [facility] [address], [address] [place], [place] [address] Tel: [phone] Fax: [phone]  Verbal informed consent was obtained from the patient, patient was informed of potential risk of procedure, including bruising, bleeding, hematoma formation, infection, muscle weakness, muscle pain, numbness, among others.        MNC    Nerve / Sites Muscle Latency Ref. Amplitude Ref. Rel Amp Segments  Distance Velocity Ref. Area    ms ms mV mV %  cm m/s m/s mVms  R Median - APB     Wrist APB 4.4 ?4.4 5.7 ?4.0 100 Wrist - APB 7   13.2     Upper arm APB 9.5  4.5  79.6 Upper arm - Wrist 25 49 ?49 12.2  R Peroneal - EDB     Ankle EDB 4.9 ?6.5 4.1 ?2.0 100 Ankle - EDB 9   10.0     Fib head EDB 11.5  3.7  90 Fib head - Ankle 29 44 ?44 9.7     Pop fossa EDB 13.8  3.5  94 Pop fossa - Fib head 10 44 ?44 9.4         Pop fossa - Ankle      L Peroneal - EDB     Ankle EDB 4.7 ?6.5 5.0 ?2.0 100 Ankle - EDB 9   12.6     Fib head EDB 11.3  4.4  87.6 Fib head - Ankle 29 44 ?44 12.3     Pop fossa EDB 13.6  4.2  94.9 Pop fossa - Fib head 10 44 ?44 11.9         Pop fossa - Ankle      R Tibial - AH     Ankle AH 3.9 ?5.8 4.3 ?4.0 100 Ankle - AH 9   9.1     Pop fossa AH 14.1  2.6  61 Pop fossa - Ankle 42 41 ?41 3.9  L Tibial - AH     Ankle AH 4.1 ?5.8 4.2 ?4.0 100  Ankle - AH 9   8.2     Pop fossa AH 14.0  2.8  66.4 Pop fossa - Ankle 41 41 ?41 6.8               SSR    Nerve / Sites Latency   s  R Sympathetic - Foot     Foot 2.43       SNC    Nerve / Sites Rec. Site Peak Lat Ref.  Amp Ref. Segments Distance Peak Diff Ref.    ms ms V V  cm ms ms  R Sural - Ankle (Calf)     Calf Ankle 4.3 ?4.4 7 ?6 Calf - Ankle 14    L Sural - Ankle (Calf)     Calf Ankle 4.1 ?4.4 6 ?6 Calf - Ankle 14    R Superficial peroneal - Ankle     Lat leg Ankle 4.1 ?4.4 6 ?6 Lat leg - Ankle 14    L Superficial peroneal - Ankle     Lat leg Ankle 4.2 ?4.4 6 ?6 Lat leg - Ankle 14    R Median, Ulnar - Transcarpal comparison     Median Palm Wrist 2.7 ?2.2 36 ?35 Median Palm - Wrist 8       Ulnar Palm Wrist 2.2 ?2.2 8 ?12 Ulnar Palm - Wrist 8          Median Palm - Ulnar Palm  0.5 ?0.4  R Median - Orthodromic (Dig II, Mid palm)     Dig II Wrist 4.1 ?3.4 4 ?10 Dig II - Wrist 13    R Ulnar - Orthodromic, (Dig V, Mid palm)     Dig V Wrist 3.1 ?3.1 6 ?5 Dig V - Wrist 27                     F  Wave    Nerve F Lat Ref.   ms  ms  R Tibial - AH 63.7 ?56.0  L Tibial - AH 66.5 ?56.0         EMG Summary Table    Spontaneous MUAP Recruitment  Muscle IA Fib PSW Fasc Other Amp Dur. Poly Pattern  R. Deltoid Normal None None None _______ Normal Normal Normal Normal  R. Triceps brachii Normal None None None _______ Normal Normal Normal Normal  R. Pronator teres Normal None None None _______ Normal Normal Normal Normal  R. First dorsal interosseous Normal None None None _______ Normal Normal Normal Normal  R. Opponens pollicis Normal None None None _______ Normal Normal Normal Normal  R. Vastus medialis Normal None None None _______ Normal Normal Normal Normal  R. Tibialis anterior Normal None None None _______ Normal Normal Normal Normal  R. Gastrocnemius (Medial head) Normal None None None _______ Normal Normal Normal Normal  R. Extensor hallucis longus Normal None None None _______ Normal Normal Normal Normal  R. Abductor hallucis Normal None None None _______ Normal Normal Normal Normal

## 2021-01-17 NOTE — Progress Notes (Signed)
  History: Patient here for emg/ncs. 70 year old with numbness and tingling in the hands and feet. Prior blood work includes: Vitamin B12 in May 2020 251, neg RPR and HIV - July 2021, hemoglobin A1c was 6.7 01/2020: TSH normal, cbc with anemia hgb 11.8,  cmp creat 1.31, bun 25. Further blood work below has been unremarkable(cryoglobulin, SPEP/IFE, heavy metals, pan ANCA, sed rate, angiotensin-converting enzyme, TSH, ANA with reflex, rheumatoid factor, Sjogren's, hemoglobin A1c, methylmalonic acid, B12 and folate, vitamin B6, B1). His risk factor's for distal peripheral neuropathy include alcohol intake, B12 deficiency(was 251) and diabetes (Current hgba1c 5.8 but was elevated in the past; HgbA1c 6.7 01/17/2020). We discussed these, explained NCS finding of carpal tunnel syndrome and normal lowers which may be small-fiber neuropathy. CTS options includes conservative or surgical. He does not want to pursue any invasive treatment at this time. For the symptoms in his feet, discussed continuing the B12 supplementation, decreasing alcohol intake and keeping tight control over diabetes. Also may consider plantar fasciitis as the heel is involved in his symptoms. Reviewed all with patient.  I spent over 20 minutes of face-to-face and non-face-to-face time with patient on the  1. Bilateral carpal tunnel syndrome   2. Numbness and tingling   3. Polyneuropathy    diagnosis.  This included previsit chart review, lab review, study review, order entry, electronic health record documentation, patient education on the different diagnostic and therapeutic options, counseling and coordination of care, risks and benefits of management, compliance, or risk factor reduction. This does not include time spent on emg/ncs.

## 2021-01-17 NOTE — Patient Instructions (Signed)
  Carpal Tunnel Syndrome  Carpal tunnel syndrome is a condition that causes pain, weakness, and numbness in your hand and arm. Numbness is when you cannot feel an area in your body. The carpal tunnel is a narrow area that is on the palm side of your wrist. Repeated wrist motion or certain diseases may cause swelling in the tunnel. This swelling can pinch the main nerve in the wrist. This nerve is called the median nerve. What are the causes? This condition may be caused by:  Moving your hand and wrist over and over again while doing a task.  Injury to the wrist.  Arthritis.  A sac of fluid (cyst) or abnormal growth (tumor) in the carpal tunnel.  Fluid buildup during pregnancy.  Use of tools that vibrate. Sometimes the cause is not known. What increases the risk? The following factors may make you more likely to have this condition:  Having a job that makes you do these things: ? Move your hand over and over again. ? Work with tools that vibrate, such as drills or sanders.  Being a woman.  Having diabetes, obesity, thyroid problems, or kidney failure. What are the signs or symptoms? Symptoms of this condition include:  A tingling feeling in your fingers.  Tingling or loss of feeling in your hand.  Pain in your entire arm. This pain may get worse when you bend your wrist and elbow for a long time.  Pain in your wrist that goes up your arm to your shoulder.  Pain that goes down into your palm or fingers.  Weakness in your hands. You may find it hard to grab and hold items. You may feel worse at night. How is this treated? This condition may be treated with:  Lifestyle changes. You will be asked to stop or change the activity that caused your problem.  Doing exercises and activities that make bones, muscles, and tendons stronger (physical therapy).  Learning how to use your hand again (occupational therapy).  Medicines for pain and swelling. You may have injections  in your wrist.  A wrist splint or brace.  Surgery. Follow these instructions at home: If you have a splint or brace:  Wear the splint or brace as told by your doctor. Take it off only as told by your doctor.  Loosen the splint if your fingers: ? Tingle. ? Become numb. ? Turn cold and blue.  Keep the splint or brace clean.  If the splint or brace is not waterproof: ? Do not let it get wet. ? Cover it with a watertight covering when you take a bath or a shower. Managing pain, stiffness, and swelling If told, put ice on the painful area:  If you have a removable splint or brace, remove it as told by your doctor.  Put ice in a plastic bag.  Place a towel between your skin and the bag.  Leave the ice on for 20 minutes, 2-3 times per day. Do not fall asleep with the cold pack on your skin.  Take off the ice if your skin turns bright red. This is very important. If you cannot feel pain, heat, or cold, you have a greater risk of damage to the area. Move your fingers often to reduce stiffness and swelling.   General instructions  Take over-the-counter and prescription medicines only as told by your doctor.  Rest your wrist from any activity that may cause pain. If needed, talk with your boss at work about changes   can help your wrist heal.  Do exercises as told by your doctor, physical therapist, or occupational therapist.  Keep all follow-up visits. Contact a doctor if:  You have new symptoms.  Medicine does not help your pain.  Your symptoms get worse. Get help right away if:  You have very bad numbness or tingling in your wrist or hand. Summary  Carpal tunnel syndrome is a condition that causes pain in your hand and arm.  It is often caused by repeated wrist motions.  Lifestyle changes and medicines are used to treat this problem. Surgery may help in very bad cases.  Follow your doctor's instructions about wearing a splint, resting your wrist, keeping  follow-up visits, and calling for help. This information is not intended to replace advice given to you by your health care provider. Make sure you discuss any questions you have with your health care provider. Document Revised: 03/29/2020 Document Reviewed: 03/29/2020 Elsevier Patient Education  2021 Elsevier Inc.  [place] and [employer]off's neurology in clinical practice (8th ed., pp. 2706- 1929). Elsevier."> [employer] medicine (26th ed., pp. 2489- 2501). Elsevier.">  Peripheral Neuropathy Peripheral neuropathy is a type of nerve damage. It affects nerves that carry signals between the spinal cord and the arms, legs, and the rest of the body (peripheral nerves). It does not affect nerves in the spinal cord or brain. In peripheral neuropathy, one nerve or a group of nerves may be damaged. Peripheral neuropathy is a broad category that includes many specific nerve disorders, like diabetic neuropathy, hereditary neuropathy, and carpal tunnel syndrome. What are the causes? This condition may be caused by:  Diabetes. This is the most common cause of peripheral neuropathy.  Nerve injury  Pressure or stress on a nerve that lasts a long time (Plantar Fasciitis)  Lack (deficiency) of B vitamins. This can result from alcoholism, poor diet, or a restricted diet.  Infections.  Autoimmune diseases, such as rheumatoid arthritis and systemic lupus erythematosus.  Nerve diseases that are passed from parent to child (inherited).  Some medicines, such as cancer medicines (chemotherapy).  Poisonous (toxic) substances, such as lead and mercury.  Too little blood flowing to the legs.  Kidney disease.  Thyroid disease. In some cases, the cause of this condition is not known. What are the signs or symptoms? Symptoms of this condition depend on which of your nerves is damaged. Common symptoms include:  Loss of feeling (numbness) in the feet, hands, or both.  Tingling in the feet, hands, or  both.  Burning pain.  Very sensitive skin.  Weakness.  Not being able to move a part of the body (paralysis).  Muscle twitching.  Clumsiness or poor coordination.  Loss of balance.  Not being able to control your bladder.  Feeling dizzy.  Sexual problems. How is this diagnosed? Diagnosing and finding the cause of peripheral neuropathy can be difficult. Your health care provider will take your medical history and do a physical exam. A neurological exam will also be done. This involves checking things that are affected by your brain, spinal cord, and nerves (nervous system). For example, your health care provider will check your reflexes, how you move, and what you can feel. You may have other tests, such as:  Blood tests.  Electromyogram (EMG) and nerve conduction tests. These tests check nerve function and how well the nerves are controlling the muscles.  Imaging tests, such as CT scans or MRI to rule out other causes of your symptoms.  Removing a small piece of  nerve to be examined in a lab (nerve biopsy).  Removing and examining a small amount of the fluid that surrounds the brain and spinal cord (lumbar puncture). How is this treated? Treatment for this condition may involve:  Treating the underlying cause of the neuropathy, such as diabetes, kidney disease, or vitamin deficiencies.  Stopping medicines that can cause neuropathy, such as chemotherapy.  Medicine to help relieve pain. Medicines may include: ? Prescription or over-the-counter pain medicine. ? Antiseizure medicine. ? Antidepressants. ? Pain-relieving patches that are applied to painful areas of skin.  Surgery to relieve pressure on a nerve or to destroy a nerve that is causing pain.  Physical therapy to help improve movement and balance.  Devices to help you move around (assistive devices). Follow these instructions at home: Medicines  Take over-the-counter and prescription medicines only as told  by your health care provider. Do not take any other medicines without first asking your health care provider.  Do not drive or use heavy machinery while taking prescription pain medicine. Lifestyle  Do not use any products that contain nicotine or tobacco, such as cigarettes and e-cigarettes. Smoking keeps blood from reaching damaged nerves. If you need help quitting, ask your health care provider.  Avoid or limit alcohol. Too much alcohol can cause a vitamin B deficiency, and vitamin B is needed for healthy nerves.  Eat a healthy diet. This includes: ? Eating foods that are high in fiber, such as fresh fruits and vegetables, whole grains, and beans. ? Limiting foods that are high in fat and processed sugars, such as fried or sweet foods.   General instructions  If you have diabetes, work closely with your health care provider to keep your blood sugar under control.  If you have numbness in your feet: ? Check every day for signs of injury or infection. Watch for redness, warmth, and swelling. ? Wear padded socks and comfortable shoes. These help protect your feet.  Develop a good support system. Living with peripheral neuropathy can be stressful. Consider talking with a mental health specialist or joining a support group.  Use assistive devices and attend physical therapy as told by your health care provider. This may include using a walker or a cane.  Keep all follow-up visits as told by your health care provider. This is important.   Contact a health care provider if:  You have new signs or symptoms of peripheral neuropathy.  You are struggling emotionally from dealing with peripheral neuropathy.  Your pain is not well-controlled. Get help right away if:  You have an injury or infection that is not healing normally.  You develop new weakness in an arm or leg.  You have fallen or do so frequently. Summary  Peripheral neuropathy is when the nerves in the arms, or legs are  damaged, resulting in numbness, weakness, or pain.  There are many causes of peripheral neuropathy, including diabetes, pinched nerves, vitamin deficiencies, autoimmune disease, and hereditary conditions.  Diagnosing and finding the cause of peripheral neuropathy can be difficult. Your health care provider will take your medical history, do a physical exam, and do tests, including blood tests and nerve function tests.  Treatment involves treating the underlying cause of the neuropathy and taking medicines to help control pain. Physical therapy and assistive devices may also help. This information is not intended to replace advice given to you by your health care provider. Make sure you discuss any questions you have with your health care provider. Document Revised: 08/28/2020  Document Reviewed: 08/28/2020 Elsevier Patient Education  2021 Elsevier Inc.   Plantar Fasciitis  Plantar fasciitis is a painful foot condition that affects the heel. It occurs when the band of tissue that connects the toes to the heel bone (plantar fascia) becomes irritated. This can happen as the result of exercising too much or doing other repetitive activities (overuse injury). Plantar fasciitis can cause mild irritation to severe pain that makes it difficult to walk or move. The pain is usually worse in the morning after sleeping, or after sitting or lying down for a period of time. Pain may also be worse after long periods of walking or standing. What are the causes? This condition may be caused by:  Standing for long periods of time.  Wearing shoes that do not have good arch support.  Doing activities that put stress on joints (high-impact activities). This includes ballet and exercise that makes your heart beat faster (aerobic exercise), such as running.  Being overweight.  An abnormal way of walking (gait).  Tight muscles in the back of your lower leg (calf).  High arches in your feet or flat  feet.  Starting a new athletic activity. What are the signs or symptoms? The main symptom of this condition is heel pain. Pain may get worse after the following:  Taking the first steps after a time of rest, especially in the morning after awakening, or after you have been sitting or lying down for a while.  Long periods of standing still. Pain may decrease after 30-45 minutes of activity, such as gentle walking. How is this diagnosed? This condition may be diagnosed based on your medical history, a physical exam, and your symptoms. Your health care provider will check for:  A tender area on the bottom of your foot.  A high arch in your foot or flat feet.  Pain when you move your foot.  Difficulty moving your foot. You may have imaging tests to confirm the diagnosis, such as:  X-rays.  Ultrasound.  MRI. How is this treated? Treatment for plantar fasciitis depends on how severe your condition is. Treatment may include:  Rest, ice, pressure (compression), and raising (elevating) the affected foot. This is called RICE therapy. Your health care provider may recommend RICE therapy along with over-the-counter pain medicines to manage your pain.  Exercises to stretch your calves and your plantar fascia.  A splint that holds your foot in a stretched, upward position while you sleep (night splint).  Physical therapy to relieve symptoms and prevent problems in the future.  Injections of steroid medicine (cortisone) to relieve pain and inflammation.  Stimulating your plantar fascia with electrical impulses (extracorporeal shock wave therapy). This is usually the last treatment option before surgery.  Surgery, if other treatments have not worked after 12 months. Follow these instructions at home: Managing pain, stiffness, and swelling  If directed, put ice on the painful area. To do this: ? Put ice in a plastic bag, or use a frozen bottle of water. ? Place a towel between your  skin and the bag or bottle. ? Roll the bottom of your foot over the bag or bottle. ? Do this for 20 minutes, 2-3 times a day.  Wear athletic shoes that have air-sole or gel-sole cushions, or try soft shoe inserts that are designed for plantar fasciitis.  Elevate your foot above the level of your heart while you are sitting or lying down.   Activity  Avoid activities that cause pain. Ask your health  care provider what activities are safe for you.  Do physical therapy exercises and stretches as told by your health care provider.  Try activities and forms of exercise that are easier on your joints (low impact). Examples include swimming, water aerobics, and biking. General instructions  Take over-the-counter and prescription medicines only as told by your health care provider.  Wear a night splint while sleeping, if told by your health care provider. Loosen the splint if your toes tingle, become numb, or turn cold and blue.  Maintain a healthy weight, or work with your health care provider to lose weight as needed.  Keep all follow-up visits. This is important. Contact a health care provider if you have:  Symptoms that do not go away with home treatment.  Pain that gets worse.  Pain that affects your ability to move or do daily activities. Summary  Plantar fasciitis is a painful foot condition that affects the heel. It occurs when the band of tissue that connects the toes to the heel bone (plantar fascia) becomes irritated.  Heel pain is the main symptom of this condition. It may get worse after exercising too much or standing still for a long time.  Treatment varies, but it usually starts with rest, ice, pressure (compression), and raising (elevating) the affected foot. This is called RICE therapy. Over-the-counter medicines can also be used to manage pain. This information is not intended to replace advice given to you by your health care provider. Make sure you discuss any  questions you have with your health care provider. Document Revised: 03/05/2020 Document Reviewed: 03/05/2020 Elsevier Patient Education  2021 [employer].

## 2021-01-17 NOTE — Progress Notes (Signed)
See procedure note.

## 2021-01-19 NOTE — Procedures (Signed)
Full Name: Paul Burke Gender: Male MRN #: [id number] Date of Birth: [birth year]    Visit Date: 01/17/2021 09:15 Age: 70 Years Examining Physician: Naomie Dean, MD  Requesting Provider: Eulis Foster, FNP Primary Care Provider:  Mliss Sax, MD    History:  70 year old with numbness and tingling in the hands and feet   Summary: NCS performed on the bilateral lower and right upper extremities. EMG was performed on the right upper and right lower extremities.  The right median orthodromic sensory nerve showed delayed distal peak latency (4.1 ms, normal less than 3.4) and diminished amplitude (4 V, normal greater than 10).  The right median/ulnar (palm) comparison nerve showed prolonged distal peak latency (Median Palm, 2.7 ms, N<2.2) and abnormal peak latency difference (Median Palm-Ulnar Palm, 0.5 ms, N<0.4) with a relative median delay.  The right tibial F wave showed delayed latency (63.7 ms, normal less than 56) and the left tibial F wave showed delayed latency (66.5 ms, normal less than 56) likely due to long limbs. All remaining nerves (as indicated in the following tables) were within normal limits.  All muscles (as indicated in the following tables) were within normal limits.    Conclusion: 1. There is mild carpal tunnel syndrome. 2. EMG/NCS of the lower extremities was normal. No suggestion of large-fiber polyneuropathy. However a small-fiber polyneuropathy may evade detection by this exam.    ------------------------------- Naomie Dean M.D.  [facility] [address], [address] [place], [place] [address] Tel: [phone] Fax: [phone]  Verbal informed consent was obtained from the patient, patient was informed of potential risk of procedure, including bruising, bleeding, hematoma formation, infection, muscle weakness, muscle pain, numbness, among others.        MNC    Nerve / Sites Muscle Latency Ref. Amplitude Ref. Rel Amp Segments  Distance Velocity Ref. Area    ms ms mV mV %  cm m/s m/s mVms  R Median - APB     Wrist APB 4.4 ?4.4 5.7 ?4.0 100 Wrist - APB 7   13.2     Upper arm APB 9.5  4.5  79.6 Upper arm - Wrist 25 49 ?49 12.2  R Peroneal - EDB     Ankle EDB 4.9 ?6.5 4.1 ?2.0 100 Ankle - EDB 9   10.0     Fib head EDB 11.5  3.7  90 Fib head - Ankle 29 44 ?44 9.7     Pop fossa EDB 13.8  3.5  94 Pop fossa - Fib head 10 44 ?44 9.4         Pop fossa - Ankle      L Peroneal - EDB     Ankle EDB 4.7 ?6.5 5.0 ?2.0 100 Ankle - EDB 9   12.6     Fib head EDB 11.3  4.4  87.6 Fib head - Ankle 29 44 ?44 12.3     Pop fossa EDB 13.6  4.2  94.9 Pop fossa - Fib head 10 44 ?44 11.9         Pop fossa - Ankle      R Tibial - AH     Ankle AH 3.9 ?5.8 4.3 ?4.0 100 Ankle - AH 9   9.1     Pop fossa AH 14.1  2.6  61 Pop fossa - Ankle 42 41 ?41 3.9  L Tibial - AH     Ankle AH 4.1 ?5.8 4.2 ?4.0 100  Ankle - AH 9   8.2     Pop fossa AH 14.0  2.8  66.4 Pop fossa - Ankle 41 41 ?41 6.8               SSR    Nerve / Sites Latency   s  R Sympathetic - Foot     Foot 2.43       SNC    Nerve / Sites Rec. Site Peak Lat Ref.  Amp Ref. Segments Distance Peak Diff Ref.    ms ms V V  cm ms ms  R Sural - Ankle (Calf)     Calf Ankle 4.3 ?4.4 7 ?6 Calf - Ankle 14    L Sural - Ankle (Calf)     Calf Ankle 4.1 ?4.4 6 ?6 Calf - Ankle 14    R Superficial peroneal - Ankle     Lat leg Ankle 4.1 ?4.4 6 ?6 Lat leg - Ankle 14    L Superficial peroneal - Ankle     Lat leg Ankle 4.2 ?4.4 6 ?6 Lat leg - Ankle 14    R Median, Ulnar - Transcarpal comparison     Median Palm Wrist 2.7 ?2.2 36 ?35 Median Palm - Wrist 8       Ulnar Palm Wrist 2.2 ?2.2 8 ?12 Ulnar Palm - Wrist 8          Median Palm - Ulnar Palm  0.5 ?0.4  R Median - Orthodromic (Dig II, Mid palm)     Dig II Wrist 4.1 ?3.4 4 ?10 Dig II - Wrist 13    R Ulnar - Orthodromic, (Dig V, Mid palm)     Dig V Wrist 3.1 ?3.1 6 ?5 Dig V - Wrist 11                     F  Wave    Nerve F Lat Ref.   ms  ms  R Tibial - AH 63.7 ?56.0  L Tibial - AH 66.5 ?56.0         EMG Summary Table    Spontaneous MUAP Recruitment  Muscle IA Fib PSW Fasc Other Amp Dur. Poly Pattern  R. Deltoid Normal None None None _______ Normal Normal Normal Normal  R. Triceps brachii Normal None None None _______ Normal Normal Normal Normal  R. Pronator teres Normal None None None _______ Normal Normal Normal Normal  R. First dorsal interosseous Normal None None None _______ Normal Normal Normal Normal  R. Opponens pollicis Normal None None None _______ Normal Normal Normal Normal  R. Vastus medialis Normal None None None _______ Normal Normal Normal Normal  R. Tibialis anterior Normal None None None _______ Normal Normal Normal Normal  R. Gastrocnemius (Medial head) Normal None None None _______ Normal Normal Normal Normal  R. Extensor hallucis longus Normal None None None _______ Normal Normal Normal Normal  R. Abductor hallucis Normal None None None _______ Normal Normal Normal Normal         

## 2021-01-26 LAB — MULTIPLE MYELOMA PANEL, SERUM
Albumin SerPl Elph-Mcnc: 4.3 g/dL (ref 2.9–4.4)
Albumin/Glob SerPl: 1.2 (ref 0.7–1.7)
Alpha 1: 0.2 g/dL (ref 0.0–0.4)
Alpha2 Glob SerPl Elph-Mcnc: 0.8 g/dL (ref 0.4–1.0)
B-Globulin SerPl Elph-Mcnc: 1.1 g/dL (ref 0.7–1.3)
Gamma Glob SerPl Elph-Mcnc: 1.7 g/dL (ref 0.4–1.8)
Globulin, Total: 3.8 g/dL (ref 2.2–3.9)
IgA/Immunoglobulin A, Serum: 298 mg/dL (ref 61–437)
IgG (Immunoglobin G), Serum: 1885 mg/dL — ABNORMAL HIGH (ref 603–1613)
IgM (Immunoglobulin M), Srm: 25 mg/dL (ref 20–172)
Total Protein: 8.1 g/dL (ref 6.0–8.5)

## 2021-01-26 LAB — HEMOGLOBIN A1C
Est. average glucose Bld gHb Est-mCnc: 120 mg/dL
Hgb A1c MFr Bld: 5.8 % — ABNORMAL HIGH (ref 4.8–5.6)

## 2021-01-26 LAB — B12 AND FOLATE PANEL
Folate: 14.7 ng/mL (ref >3.0)
Vitamin B-12: 1205 pg/mL (ref 232–1245)

## 2021-01-26 LAB — PAN-ANCA
ANCA Proteinase 3: <3.5 U/mL (ref 0.0–3.5)
Atypical pANCA: <1:20 {titer}
C-ANCA: <1:20 {titer}
Myeloperoxidase Ab: <9 U/mL (ref 0.0–9.0)
P-ANCA: <1:20 {titer}

## 2021-01-26 LAB — VITAMIN B6: Vitamin B6: 8.9 ug/L (ref 5.3–46.7)

## 2021-01-26 LAB — CRYOGLOBULIN

## 2021-01-26 LAB — HEAVY METALS, BLOOD

## 2021-01-26 LAB — SEDIMENTATION RATE: Sed Rate: 13 mm/hr (ref 0–30)

## 2021-01-26 LAB — SJOGREN'S SYNDROME ANTIBODS(SSA + SSB)
ENA SSA (RO) Ab: <0.2 AI (ref 0.0–0.9)
ENA SSB (LA) Ab: <0.2 AI (ref 0.0–0.9)

## 2021-01-26 LAB — VITAMIN B1: Thiamine: 108.1 nmol/L (ref 66.5–200.0)

## 2021-01-26 LAB — RHEUMATOID FACTOR: Rheumatoid fact SerPl-aCnc: <10 IU/mL (ref <14.0)

## 2021-01-26 LAB — TSH: TSH: 0.972 u[IU]/mL (ref 0.450–4.500)

## 2021-01-26 LAB — METHYLMALONIC ACID, SERUM: Methylmalonic Acid: 143 nmol/L (ref 0–378)

## 2021-01-26 LAB — ANTINUCLEAR ANTIBODIES, IFA: ANA Titer 1: NEGATIVE

## 2021-01-26 LAB — ANGIOTENSIN CONVERTING ENZYME: Angio Convert Enzyme: 5 U/L — ABNORMAL LOW (ref 14–82)

## 2021-01-30 ENCOUNTER — Other Ambulatory Visit: Payer: Self-pay | Admitting: Family Medicine

## 2021-01-30 DIAGNOSIS — K219 Gastro-esophageal reflux disease without esophagitis: Secondary | ICD-10-CM

## 2021-01-30 NOTE — Telephone Encounter (Signed)
Last OV 11/15/20 Last fill 09/17/20  #90/1

## 2021-02-08 ENCOUNTER — Telehealth: Payer: Self-pay

## 2021-02-09 ENCOUNTER — Other Ambulatory Visit: Payer: Self-pay | Admitting: Family Medicine

## 2021-02-09 DIAGNOSIS — I10 Essential (primary) hypertension: Secondary | ICD-10-CM

## 2021-02-15 NOTE — Chronic Care Management (AMB) (Signed)
Chronic Care Management Pharmacy Assistant   Name: Paul Burke  MRN: [id number] DOB: 11/13/1951  Reason for Encounter: Medication Review   02/08/2021- Patient called requesting a prescription for Ibuprofen 800 mg due to having a toothache. Patient contacted dentist office but his dentist has retired and he is in process of getting a new Education officer, community. Patient informed that Angelena Sole, CPP was unavailable at this time but he does suggest patient getting Ibuprofen 200 mg OTC and taking 4 tablets once to see if this helps with pain until he get with a new dentist. Patient agrees with this plan and will follow up with PCP and Dentist regarding toothache.  Recent office visits: None  Recent consult visits:  01/16/2021- Dr Lucia Gaskins (Neurology)  Hospital visits:  None in previous 6 months   Reviewed chart for medication changes ahead of medication coordination call.  No medication changes indicated.  BP Readings from Last 3 Encounters:  01/09/21 138/83  11/15/20 138/78  10/12/20 124/76    Lab Results  Component Value Date   HGBA1C 5.8 (H) 01/09/2021     Patient obtains medications through Vials  90 Days   Last adherence delivery included: none due to receiving a 90 day supply of medications on 11/21/2020.   Patient declined the following medications last month: Amlodipine 10 mg tablet Daily Breakfast Levothyroxine 75 MCG tablet Daily before Breakfast Lisinopril 40 MG tablet Daily breakfast  Allopurinol 100 mg tablet BID Breakfast, Bedtime   Due to receiving a 90 day supply on 11/21/2020 Omeprazole 40 mg capsule Daily Breakfast Meloxicam15mg  tablet- 1 tablet daily as needed for pain   Due to receiving a 77 day supply on 12/11/2020. Sildenafil 100 mg tablet PRN- due to receiving a 20 day supply on 12/13/2020 Gabapentin 400 MG- 1-2 capsules three times dailydue to receiving a 31 day supply on 01/09/2021 Atorvastatin 20 mg- 1 tablet daily due to receiving a 90 day supply on  11/22/2020 from [facility]. Meclizine 25 Tablet- 1 tablet every 8 hours as needed for dizziness (PRN use) Ferrous sulfate 325 MG tablet Daily Breakfast(OTC) Vitamin B-12 500 Mcg TabletDaily Breakfast(OTC) Aspirin 81 mg tablet Daily Breakfast (OTC) Vitamin D 2000 units daily (OTC)    Patient is due for next adherence delivery on: 02/22/2021. 02/15/2021- Called patient and reviewed medications and coordinated delivery, no answer, left message to return call. 02/22/2021- Kerry Kass, CPA called patient, no answer, left message to return call.  Patient has not returned call to discuss medication coordination, Angelena Sole, CPP notified. Encounter closed.   Medications: Outpatient Encounter Medications as of 02/08/2021  Medication Sig  . allopurinol (ZYLOPRIM) 100 MG tablet Take 1 tablet (100 mg total) by mouth 2 (two) times daily.  [name] aspirin 81 MG chewable tablet Chew 81 mg by mouth daily.   [name] atorvastatin (LIPITOR) 20 MG tablet TAKE 1 TABLET BY MOUTH EVERY DAY  . Cholecalciferol 50 MCG (2000 UT) CAPS Take 2,000 Units by mouth daily.  . ferrous sulfate 325 (65 FE) MG EC tablet Take 325 mg by mouth daily with breakfast.  . gabapentin (NEURONTIN) 400 MG capsule Take 1-2 capsules (400-800 mg total) by mouth 3 (three) times daily.  [name] levothyroxine (SYNTHROID) 75 MCG tablet Take 1 tablet (75 mcg total) by mouth daily before breakfast.  . meloxicam (MOBIC) 15 MG tablet TAKE ONE TABLET BY MOUTH DAILY AS NEEDED FOR PAIN  . omeprazole (PRILOSEC) 40 MG capsule Take 1 capsule (40 mg total) by mouth daily.  [name] OVER THE  COUNTER MEDICATION Slow release iron supplement  . sildenafil (VIAGRA) 100 MG tablet Take 0.5-1 tablets (50-100 mg total) by mouth daily as needed for erectile dysfunction.  . vitamin B-12 (CYANOCOBALAMIN) 500 MCG tablet Take 500 mcg by mouth daily.  . [DISCONTINUED] amLODipine (NORVASC) 10 MG tablet TAKE ONE TABLET BY MOUTH EVERY MORNING  .  [DISCONTINUED] lisinopril (ZESTRIL) 40 MG tablet TAKE ONE TABLET BY MOUTH EVERY MORNING   No facility-administered encounter medications on file as of 02/08/2021.    Star Rating Drugs: Atorvastatin 20 mg- Last filled 11/22/2020 [facility] supply [facility]. Lisinopril 40 mg- Last filled 11/21/2020 for 90 day supply from [facility].  SIG: Billee Cashing, [facility] Clinical Pharmacist Assistant [phone]

## 2021-03-08 ENCOUNTER — Encounter: Payer: Self-pay | Admitting: Family Medicine

## 2021-03-08 ENCOUNTER — Telehealth (INDEPENDENT_AMBULATORY_CARE_PROVIDER_SITE_OTHER): Payer: Medicare Other | Admitting: Family Medicine

## 2021-03-08 VITALS — Wt 230.0 lb

## 2021-03-08 DIAGNOSIS — J069 Acute upper respiratory infection, unspecified: Secondary | ICD-10-CM | POA: Diagnosis not present

## 2021-03-08 NOTE — Patient Instructions (Signed)

## 2021-03-08 NOTE — Progress Notes (Signed)
[facility]IMARY [facility] [address] [place] [address] Dept: [phone] Dept Fax: [phone]  Telephone Visit  I connected with Paul Burke on 03/08/21 at  2:30 PM EDT by telephone and verified that I am speaking with the correct person using two identifiers.  Location patient: Home Location provider: Clinic Persons participating in the virtual visit: Patient, Provider  I discussed the limitations of evaluation and management by telemedicine and the availability of in person appointments. The patient expressed understanding and agreed to proceed.  Chief Complaint  Patient presents with  . Acute Visit    C/o having ST, runny nose, cough x 4-5 days.  He has Nyquil, cough drops and Ibuprofen with some relief.        SUBJECTIVE:  HPI: Paul Burke is a 70 y.o. male who presents with a 4-5 day history of nasal congestion, rhinorrhea, and cough. He has been using Nyquil and cough drops and finds these symptoms have improved. However, now he is having a sore throat with pain on swallowing. The cough drops are not relieving this. He notes his brother has similar symptoms and was seen at the hospital. He was provided with a Rx for a cough syrup.  Past Medical History:  Diagnosis Date  . Gout   . Hypertension   . Thyroid disease    Past Surgical History:  Procedure Laterality Date  . NO PAST SURGERIES     Family History  Problem Relation Age of Onset  . Neuropathy Neg Hx    Social History   Tobacco Use  . Smoking status: Former Smoker    Types: Cigarettes    Quit date: 1986    Years since quitting: 36.2  . Smokeless tobacco: Never Used  Vaping Use  . Vaping Use: Never used  Substance Use Topics  . Alcohol use: Yes    Alcohol/week: 5.0 - 6.0 standard drinks    Types: 5 - 6 Cans of beer per week    Comment: "weekends I drink 5-6 beers"  . Drug use: Never    Current Outpatient Medications:  .  allopurinol  (ZYLOPRIM) 100 MG tablet, Take 1 tablet (100 mg total) by mouth 2 (two) times daily., Disp: 180 tablet, Rfl: 1 .  amLODipine (NORVASC) 10 MG tablet, TAKE ONE TABLET BY MOUTH EVERY MORNING, Disp: 90 tablet, Rfl: 1 .  aspirin 81 MG chewable tablet, Chew 81 mg by mouth daily. , Disp: , Rfl:  .  atorvastatin (LIPITOR) 20 MG tablet, TAKE 1 TABLET BY MOUTH EVERY DAY, Disp: 90 tablet, Rfl: 3 .  Cholecalciferol 50 MCG (2000 UT) CAPS, Take 2,000 Units by mouth daily., Disp: , Rfl:  .  ferrous sulfate 325 (65 FE) MG EC tablet, Take 325 mg by mouth daily with breakfast., Disp: , Rfl:  .  gabapentin (NEURONTIN) 400 MG capsule, Take 1-2 capsules (400-800 mg total) by mouth 3 (three) times daily., Disp: 540 capsule, Rfl: 3 .  levothyroxine (SYNTHROID) 75 MCG tablet, Take 1 tablet (75 mcg total) by mouth daily before breakfast., Disp: 90 tablet, Rfl: 3 .  lisinopril (ZESTRIL) 40 MG tablet, TAKE ONE TABLET BY MOUTH EVERY MORNING, Disp: 90 tablet, Rfl: 1 .  meloxicam (MOBIC) 15 MG tablet, TAKE ONE TABLET BY MOUTH DAILY AS NEEDED FOR PAIN, Disp: 13 tablet, Rfl: 0 .  omeprazole (PRILOSEC) 40 MG capsule, Take 1 capsule (40 mg total) by mouth daily., Disp: 60 capsule, Rfl: 1 .  sildenafil (VIAGRA) 100 MG tablet,  Take 0.5-1 tablets (50-100 mg total) by mouth daily as needed for erectile dysfunction., Disp: 40 tablet, Rfl: 3 .  vitamin B-12 (CYANOCOBALAMIN) 500 MCG tablet, Take 500 mcg by mouth daily., Disp: , Rfl:   No Known Allergies  ROS: See pertinent positives and negatives per HPI.  OBSERVATIONS/OBJECTIVE:  VITALS per patient if applicable: Vitals:   03/08/21 1439  Weight: 230 lb (104.3 kg)   No coughing or sniffling is audible during the duration of the call.  ASSESSMENT AND PLAN:  1. Viral upper respiratory illness Mr. Vaillancourt's symptoms sound most consistent with a viral URI. We discussed home care of a viral illness. I recommended he drink hot tea with honey +/- lemon 4-5 times a day. He might also  consider using Chloraseptic spray or Cepachol lozenges to help with the throat pain. I was unwilling to prescribe a cough medication based solely on a telephone call.   I discussed the assessment and treatment plan with the patient. The patient was provided an opportunity to ask questions and all were answered. The patient agreed with the plan and demonstrated an understanding of the instructions.   The patient was advised to call back or seek an in-person evaluation if the symptoms worsen or if the condition fails to improve as anticipated.  I spent 17 minutes on this telephone encounter.  Loyola Mast, MD

## 2021-03-15 ENCOUNTER — Telehealth: Payer: Self-pay

## 2021-03-15 NOTE — Progress Notes (Signed)
Chronic Care Management Pharmacy Assistant   Name: Paul Burke  MRN: [id number] DOB: 1951-06-03  Reason for Encounter: Medication Review/Medication Coordination call.   Recent office visits:  03/08/2021 PCP Office Herbie Drape  Recent consult visits:  No recent Consult Visits  Hospital visits:  None in previous 6 months  Medications: Outpatient Encounter Medications as of 03/15/2021  Medication Sig  . allopurinol (ZYLOPRIM) 100 MG tablet Take 1 tablet (100 mg total) by mouth 2 (two) times daily.  [name] amLODipine (NORVASC) 10 MG tablet TAKE ONE TABLET BY MOUTH EVERY MORNING  . aspirin 81 MG chewable tablet Chew 81 mg by mouth daily.   [name] atorvastatin (LIPITOR) 20 MG tablet TAKE 1 TABLET BY MOUTH EVERY DAY  . Cholecalciferol 50 MCG (2000 UT) CAPS Take 2,000 Units by mouth daily.  . ferrous sulfate 325 (65 FE) MG EC tablet Take 325 mg by mouth daily with breakfast.  . gabapentin (NEURONTIN) 400 MG capsule Take 1-2 capsules (400-800 mg total) by mouth 3 (three) times daily.  [name] levothyroxine (SYNTHROID) 75 MCG tablet Take 1 tablet (75 mcg total) by mouth daily before breakfast.  . lisinopril (ZESTRIL) 40 MG tablet TAKE ONE TABLET BY MOUTH EVERY MORNING  . meloxicam (MOBIC) 15 MG tablet TAKE ONE TABLET BY MOUTH DAILY AS NEEDED FOR PAIN  . omeprazole (PRILOSEC) 40 MG capsule Take 1 capsule (40 mg total) by mouth daily.  . sildenafil (VIAGRA) 100 MG tablet Take 0.5-1 tablets (50-100 mg total) by mouth daily as needed for erectile dysfunction.  . vitamin B-12 (CYANOCOBALAMIN) 500 MCG tablet Take 500 mcg by mouth daily.   No facility-administered encounter medications on file as of 03/15/2021.    Star Rating Drugs: Atorvastatin 20 mg- Last filled 11/22/2020 [facility]. Lisinopril 40 mg- Last filled 02/18/2021 for 90 day supply from [facility].  Reviewed chart for medication changes ahead of medication coordination call.  BP Readings from Last 3 Encounters:  01/09/21  138/83  11/15/20 138/78  10/12/20 124/76    Lab Results  Component Value Date   HGBA1C 5.8 (H) 01/09/2021     Patient obtains medications through Vials  90 Days   Last adherence delivery included:  02/15/2021- Called patient and reviewed medications and coordinated delivery, no answer, left message to return call. 02/22/2021- Kerry Kass, CPA called patient, no answer, left message to return call.  Patient received medications   Amlodipine 10 mg one tablet Daily on 03/07/2021  Levothyroxine 75 MCG one tablet Daily on 02/18/2021  Lisinopril 40 mg One tablet Daily on 02/18/2021.  Omeprazole 40 mg One  Capsule Daily on 02/18/2021  Patient declined the following medications last month:  Allopurinol 100 mg tablet BID Breakfast, Bedtime- Due to receiving a 90 day supply on 11/21/2020  Meloxicam15mg  tablet- 1 tablet daily as needed for pain-Due to receiving a 77 day supply on 12/11/2020.  Sildenafil 100 mg tablet PRN- due to receiving a 20 day supply on 12/13/2020  Gabapentin 400 MG- 1-2 capsules three times dailydue to receiving a 31 day supply on 01/09/2021  Atorvastatin 20 mg- 1 tablet daily due to receiving a 90 day supply on [facility].  Meclizine 25 Tablet- 1 tablet every 8 hours as needed for dizziness (PRN use)  Ferrous sulfate 325 MG tablet Daily Breakfast(OTC)  Vitamin B-12 500 Mcg TabletDaily Breakfast(OTC)  Aspirin 81 mg tablet Daily Breakfast (OTC)  Vitamin D 2000 units daily (OTC) Patient is due for next adherence delivery on: 03/25/2021.  Called patient and reviewed medications and coordinated delivery.  This delivery to include:None ID   Patient declined the following medications:  Amlodipine 10 mg one tablet Daily on 03/07/2021,receive 73 day supply  Levothyroxine 75 MCG one tablet Daily on 02/18/2021 receive 90 day supply  Lisinopril 40 mg One tablet Daily on 02/18/2021.recevie 90 day supply  Omeprazole 40 mg One   Capsule Daily on 02/18/2021 receive 90 day supply.  Allopurinol 100 mg tablet BID Breakfast, Bedtime- Due to receiving a 90 day supply on 11/21/2020, patient states he has about a month left.  Meloxicam15mg  tablet- 1 tablet daily as needed for pain-Due to receiving a 77 day supply on 12/11/2020.  Sildenafil 100 mg tablet PRN- due to receiving a 20 day supply on 12/13/2020  Gabapentin 400 MG- 1-2 capsules three times dailydue to receiving a 31 day supply on 01/09/2021, patient states he has about a month left.  Atorvastatin 20 mg- 1 tablet daily due to receiving a 90 day supply on [facility].-patient states he has a month left and takes atorvastatin every day without missing a dose.Patient denies any side effects at this time.[facility]oke with [facility] to confirm last fill day on 11/22/2021, Per [facility] the last time they filled atorvastatin was on 11/22/2020.Patient request a refill in March 2022 but never pick up the medication. Notified Clinical Pharmacist.   Meclizine 25 Tablet- 1 tablet every 8 hours as needed for dizziness (PRN use)  Ferrous sulfate 325 MG tablet Daily Breakfast(OTC)  Vitamin B-12 500 Mcg TabletDaily Breakfast(OTC)  Aspirin 81 mg tablet Daily Breakfast (OTC)  Vitamin D 2000 units daily (OTC)    Patient needs refills for None ID.  Confirmed delivery date of No Fill Needed , advised patient that pharmacy will contact them the morning of delivery.  Blood pressure readings: Patient states his blood pressure is "normal" and does not check it all the time.  Everlean Cherry Clinical Pharmacist Assistant [phone]

## 2021-05-08 ENCOUNTER — Telehealth: Payer: Self-pay

## 2021-05-08 NOTE — Progress Notes (Signed)
Void Note Open in error

## 2021-05-08 NOTE — Progress Notes (Signed)
Chronic Care Management Pharmacy Assistant   Name: EON ZUNKER  MRN: [id number] DOB: 1951/05/13  Reason for Encounter: Medication Review/Medication Coordination Call.   Recent office visits:  No recent Office Visit  Recent consult visits:  No recent Consult Visit  Hospital visits:  None in previous 6 months  Medications: Outpatient Encounter Medications as of 05/08/2021  Medication Sig   allopurinol (ZYLOPRIM) 100 MG tablet Take 1 tablet (100 mg total) by mouth 2 (two) times daily.   amLODipine (NORVASC) 10 MG tablet TAKE ONE TABLET BY MOUTH EVERY MORNING   aspirin 81 MG chewable tablet Chew 81 mg by mouth daily.    atorvastatin (LIPITOR) 20 MG tablet TAKE 1 TABLET BY MOUTH EVERY DAY   Cholecalciferol 50 MCG (2000 UT) CAPS Take 2,000 Units by mouth daily.   ferrous sulfate 325 (65 FE) MG EC tablet Take 325 mg by mouth daily with breakfast.   gabapentin (NEURONTIN) 400 MG capsule Take 1-2 capsules (400-800 mg total) by mouth 3 (three) times daily.   levothyroxine (SYNTHROID) 75 MCG tablet Take 1 tablet (75 mcg total) by mouth daily before breakfast.   lisinopril (ZESTRIL) 40 MG tablet TAKE ONE TABLET BY MOUTH EVERY MORNING   meloxicam (MOBIC) 15 MG tablet TAKE ONE TABLET BY MOUTH DAILY AS NEEDED FOR PAIN   omeprazole (PRILOSEC) 40 MG capsule Take 1 capsule (40 mg total) by mouth daily.   sildenafil (VIAGRA) 100 MG tablet Take 0.5-1 tablets (50-100 mg total) by mouth daily as needed for erectile dysfunction.   vitamin B-12 (CYANOCOBALAMIN) 500 MCG tablet Take 500 mcg by mouth daily.   No facility-administered encounter medications on file as of 05/08/2021.   Star Rating Drugs: Atorvastatin 20 mg- Last filled 05/01/2021 for 90 day supply from [facility]. Lisinopril 40 mg- Last filled 02/18/2021 for 90 day supply from [facility].  Reviewed chart for medication changes ahead of medication coordination call.  BP Readings from Last 3 Encounters:  01/09/21 138/83  11/15/20 138/78   10/12/20 124/76    Lab Results  Component Value Date   HGBA1C 5.8 (H) 01/09/2021     Patient obtains medications through Vials  90 Days   Last adherence delivery included:  None ID  Patient declined medication last month: Amlodipine 10 mg one tablet Daily on 03/07/2021,receive 73 day supply Levothyroxine 75 MCG one tablet Daily on 02/18/2021 receive 90 day supply Lisinopril 40 mg One tablet Daily on 02/18/2021.recevie 90 day supply Omeprazole 40 mg One  Capsule Daily on 02/18/2021 receive 90 day supply. Allopurinol 100 mg tablet BID Breakfast, Bedtime- Due to receiving a 90 day supply on 11/21/2020, patient states he has about a month left. Meloxicam 15 mg tablet- 1 tablet daily as needed for pain-Due to receiving a 77 day supply on 12/11/2020. Sildenafil 100 mg tablet PRN - due to receiving a 20 day supply on 12/13/2020 Gabapentin 400 MG- 1-2 capsules three times daily due to receiving a 31 day supply on 01/09/2021, patient states he has about a month left. Atorvastatin 20 mg- 1 tablet daily due to receiving a 90 day supply [facility]patient states he has a month left and takes atorvastatin every day without missing a dose.Patient denies any side effects at this time.Spoke with [facility] to confirm last fill day on 11/22/2021, Per [facility] the last time they filled atorvastatin was on 11/22/2020.Patient request a refill in March 2022 but never pick up the medication. Notified Clinical Pharmacist.  Meclizine 25 Tablet- 1 tablet  every 8 hours as needed for dizziness (PRN use) Ferrous sulfate 325 MG  tablet Daily Breakfast (OTC) Vitamin B-12 500 Mcg Tablet Daily Breakfast (OTC)  Aspirin 81 mg tablet Daily Breakfast (OTC) Vitamin D 2000 units daily (OTC)   Patient is due for next adherence delivery on: 05/17/2021. Called patient and reviewed medications and coordinated delivery.  This delivery to include: Levothyroxine 75 MCG one tablet Daily  Lisinopril 40  mg One tablet Daily Meloxicam 15 mg tablet- 1 tablet daily as needed for pain  Coordinated acute fill for Gabapentin 400 mg to be delivered on 05/10/2021. Patient states he takes gabapentin 1 capsule four times daily.Completed acute form and sent to clinical pharmacist for review.  Patient declined the following medications: Amlodipine 10 mg one tablet Daily on 03/07/2021,[facility].Patient states he has 30 days supply on hand as of 05/09/2021.Patient denies filling amlodipine at a different pharmacy, forgetting to take it or side effects. Omeprazole 40 mg One  Capsule Daily on 05/01/2021 receive 30 day supply. Allopurinol 100 mg tablet BID Breakfast, Bedtime- Due to receiving a 90 day supply on 04/10/2021. Sildenafil 100 mg tablet PRN - due to receiving a 20 day supply on 12/13/2020 Atorvastatin 20 mg- 1 tablet daily due to receiving a 90 day supply on 05/01/2021 . Meclizine 25 Tablet- (OTC) Ferrous sulfate 325 MG  tablet Daily Breakfast (OTC) Vitamin B-12 500 Mcg Tablet Daily Breakfast (OTC)  Aspirin 81 mg tablet Daily Breakfast (OTC) Vitamin D 2000 units daily (OTC)   Patient needs refills for Meloxicam 15 mg.  Confirmed delivery date of 05/17/2021, advised patient that pharmacy will contact them the morning of delivery.  Patient states he does not want to sync his medication back up.Patient reports he is doing good with they way it is.  Everlean Cherry Clinical Pharmacist Assistant [phone]

## 2021-05-21 ENCOUNTER — Other Ambulatory Visit: Payer: Self-pay | Admitting: Family Medicine

## 2021-05-21 DIAGNOSIS — K219 Gastro-esophageal reflux disease without esophagitis: Secondary | ICD-10-CM

## 2021-05-21 NOTE — Telephone Encounter (Signed)
Please see message and advise.  Thank you. ° °

## 2021-05-21 NOTE — Telephone Encounter (Signed)
Last OV 11/15/20 Last fill 01/30/21 #60/1

## 2021-06-07 ENCOUNTER — Telehealth: Payer: Self-pay

## 2021-06-07 NOTE — Progress Notes (Signed)
Chronic Care Management Pharmacy Assistant   Name: Paul Burke  MRN: [id number] DOB: Jan 19, 1951  Reason for Encounter: Medication Review/Medication Coordination Call.   Recent office visits:  No recent Office Visits  Recent consult visits:  No recent Consult [facility] visits:  None in previous 6 months  Medications: Outpatient Encounter Medications as of 06/07/2021  Medication Sig   allopurinol (ZYLOPRIM) 100 MG tablet Take 1 tablet (100 mg total) by mouth 2 (two) times daily.   amLODipine (NORVASC) 10 MG tablet TAKE ONE TABLET BY MOUTH EVERY MORNING   aspirin 81 MG chewable tablet Chew 81 mg by mouth daily.    atorvastatin (LIPITOR) 20 MG tablet TAKE 1 TABLET BY MOUTH EVERY DAY   Cholecalciferol 50 MCG (2000 UT) CAPS Take 2,000 Units by mouth daily.   ferrous sulfate 325 (65 FE) MG EC tablet Take 325 mg by mouth daily with breakfast.   gabapentin (NEURONTIN) 400 MG capsule Take 1-2 capsules (400-800 mg total) by mouth 3 (three) times daily.   levothyroxine (SYNTHROID) 75 MCG tablet Take 1 tablet (75 mcg total) by mouth daily before breakfast.   lisinopril (ZESTRIL) 40 MG tablet TAKE ONE TABLET BY MOUTH EVERY MORNING   meloxicam (MOBIC) 15 MG tablet TAKE ONE TABLET BY MOUTH DAILY AS NEEDED FOR PAIN   omeprazole (PRILOSEC) 40 MG capsule TAKE ONE CAPSULE BY MOUTH DAILY   sildenafil (VIAGRA) 100 MG tablet Take 0.5-1 tablets (50-100 mg total) by mouth daily as needed for erectile dysfunction.   vitamin B-12 (CYANOCOBALAMIN) 500 MCG tablet Take 500 mcg by mouth daily.   No facility-administered encounter medications on file as of 06/07/2021.    Care Gaps: Shingrix, Coivd 19 Star Rating Drugs: Atorvastatin 20 mg- Last filled 05/01/2021 for 90 day supply from [facility]. Lisinopril 40 mg- Last filled 05/14/2021 for 90 day supply from [facility].  Reviewed chart for medication changes ahead of medication coordination call.  BP Readings from Last 3 Encounters:  01/09/21 138/83   11/15/20 138/78  10/12/20 124/76    Lab Results  Component Value Date   HGBA1C 5.8 (H) 01/09/2021     Patient obtains medications through Vials  90 Days   Last adherence delivery included:  Levothyroxine 75 MCG one tablet Daily Lisinopril 40 mg One tablet Daily Meloxicam 15 mg tablet- 1 tablet daily as needed for pain   Coordinated acute fill for Gabapentin 400 mg to be delivered on 05/10/2021. Patient states he takes gabapentin 1 capsule four times daily.Completed acute form and sent to clinical pharmacist for review.  Patient declined medications last month  Amlodipine 10 mg one tablet Daily on 03/07/2021,[facility].Patient states he has 30 days supply on hand as of 05/09/2021.Patient denies filling amlodipine at a different pharmacy, forgetting to take it or side effects. Omeprazole 40 mg One  Capsule Daily on 05/01/2021 receive 30 day supply. Allopurinol 100 mg tablet BID Breakfast, Bedtime- Due to receiving a 90 day supply on 04/10/2021. Sildenafil 100 mg tablet PRN - due to receiving a 20 day supply on 12/13/2020 Atorvastatin 20 mg- 1 tablet daily due to receiving a 90 day supply on 05/01/2021 . Meclizine 25 Tablet- (OTC) Ferrous sulfate 325 MG  tablet Daily Breakfast (OTC) Vitamin B-12 500 Mcg Tablet Daily Breakfast (OTC)  Aspirin 81 mg tablet Daily Breakfast (OTC) Vitamin D 2000 units daily (OTC)   Patient is due for next adherence delivery on: 06/18/2021. Called patient and reviewed medications and coordinated delivery.  This  delivery to include: None ID  Patient declined the following medications (meds) due to (reason) Levothyroxine 75 MCG one tablet Daily on 05/14/2021,[facility] Lisinopril 40 mg One tablet Daily on 05/14/2021,receive [facility] supply [facility] Meloxicam 15 mg tablet- 1 tablet daily as needed for pain on 05/14/2021,receive 13 day supply [facility] Amlodipine 10  mg one tablet Daily on 05/30/2021,receive [facility] supply [facility]. Omeprazole 40 mg One  Capsule Daily on 05/01/2021 receive 30 day supply.(Adequate supply)  Allopurinol 100 mg tablet BID Breakfast, Bedtime- Due to receiving a 90 day supply on 04/10/2021. Sildenafil 100 mg tablet PRN - due to receiving a 20 day supply on 12/13/2020 Atorvastatin 20 mg- 1 tablet daily due to receiving a 90 day supply on 05/01/2021 . Meclizine 25 Tablet- (OTC) Ferrous sulfate 325 MG  tablet Daily Breakfast (OTC) Vitamin B-12 500 Mcg Tablet Daily Breakfast (OTC)  Aspirin 81 mg tablet Daily Breakfast (OTC) Vitamin D 2000 units daily (OTC)  Gabapentin 400 mg capsule Patient states he takes gabapentin 1 capsule four times daily on 05/10/2021,receive [facility] supply [facility](Adequate supply).  Patient needs refills for None ID.  Confirmed delivery date of No fill needed, advised patient that pharmacy will contact them the morning of delivery.  Everlean Cherry Clinical Pharmacist Assistant [phone]

## 2021-06-18 ENCOUNTER — Ambulatory Visit: Payer: Medicare Other | Admitting: Family Medicine

## 2021-06-20 ENCOUNTER — Other Ambulatory Visit: Payer: Self-pay

## 2021-06-20 ENCOUNTER — Encounter: Payer: Self-pay | Admitting: Family Medicine

## 2021-06-20 ENCOUNTER — Ambulatory Visit (INDEPENDENT_AMBULATORY_CARE_PROVIDER_SITE_OTHER): Payer: Medicare Other | Admitting: Family Medicine

## 2021-06-20 VITALS — BP 118/72 | HR 95 | Temp 97.7°F | Ht 72.0 in | Wt 233.2 lb

## 2021-06-20 DIAGNOSIS — G5603 Carpal tunnel syndrome, bilateral upper limbs: Secondary | ICD-10-CM

## 2021-06-20 DIAGNOSIS — E538 Deficiency of other specified B group vitamins: Secondary | ICD-10-CM

## 2021-06-20 LAB — VITAMIN B12: Vitamin B-12: >1550 pg/mL — ABNORMAL HIGH (ref 211–911)

## 2021-06-20 NOTE — Progress Notes (Signed)
Established Patient Office Visit  Subjective:  Patient ID: Paul Burke, male    DOB: 11/22/1951  Age: 70 y.o. MRN: [id number]  CC:  Chief Complaint  Patient presents with   Numbness    C/O tingling with numbness in hands and feet. Per patient still having same issues having to double up on medication to help with this.     HPI WANYA BANGURA presents for follow-up of bilateral carpal tunnel syndrome and polyneuropathy.  Patient is taking 4 to 800 mg of gabapentin 3 times daily.  He is interested in injection therapy.  He is taking 5000 mcg of B12 daily.  Compliant with his Synthroid daily.  Past Medical History:  Diagnosis Date   Gout    Hypertension    Thyroid disease     Past Surgical History:  Procedure Laterality Date   NO PAST SURGERIES      Family History  Problem Relation Age of Onset   Neuropathy Neg Hx     Social History   Socioeconomic History   Marital status: Single    Spouse name: Not on file   Number of children: Not on file   Years of education: Not on file   Highest education level: 11th grade  Occupational History   Not on file  Tobacco Use   Smoking status: Former    Types: Cigarettes    Quit date: 28    Years since quitting: 36.5   Smokeless tobacco: Never  Vaping Use   Vaping Use: Never used  Substance and Sexual Activity   Alcohol use: Yes    Alcohol/week: 5.0 - 6.0 standard drinks    Types: 5 - 6 Cans of beer per week    Comment: "weekends I drink 5-6 beers"   Drug use: Never   Sexual activity: Not on file  Other Topics Concern   Not on file  Social History Narrative   Lives at home alone   Right handed   Caffeine: "very seldom"   Social Determinants of Corporate investment banker Strain: Not on file  Food Insecurity: No Food Insecurity   Worried About Programme researcher, broadcasting/film/video in the Last Year: Never true   Ran Out of Food in the Last Year: Never true  Transportation Needs: Not on file  Physical Activity: Inactive   Days  of Exercise per Week: 0 days   Minutes of Exercise per Session: 0 min  Stress: No Stress Concern Present   Feeling of Stress : Not at all  Social Connections: Socially Isolated   Frequency of Communication with Friends and Family: More than three times a week   Frequency of Social Gatherings with Friends and Family: More than three times a week   Attends Religious Services: Never   Database administrator or Organizations: No   Attends Engineer, structural: Never   Marital Status: Never married  Catering manager Violence: Not At Risk   Fear of Current or Ex-Partner: No   Emotionally Abused: No   Physically Abused: No   Sexually Abused: No    Outpatient Medications Prior to Visit  Medication Sig Dispense Refill   allopurinol (ZYLOPRIM) 100 MG tablet Take 1 tablet (100 mg total) by mouth 2 (two) times daily. 180 tablet 1   amLODipine (NORVASC) 10 MG tablet TAKE ONE TABLET BY MOUTH EVERY MORNING 90 tablet 1   aspirin 81 MG chewable tablet Chew 81 mg by mouth daily.      atorvastatin (  LIPITOR) 20 MG tablet TAKE 1 TABLET BY MOUTH EVERY DAY 90 tablet 3   Cholecalciferol 50 MCG (2000 UT) CAPS Take 2,000 Units by mouth daily.     ferrous sulfate 325 (65 FE) MG EC tablet Take 325 mg by mouth daily with breakfast.     gabapentin (NEURONTIN) 400 MG capsule Take 1-2 capsules (400-800 mg total) by mouth 3 (three) times daily. 540 capsule 3   levothyroxine (SYNTHROID) 75 MCG tablet Take 1 tablet (75 mcg total) by mouth daily before breakfast. 90 tablet 3   lisinopril (ZESTRIL) 40 MG tablet TAKE ONE TABLET BY MOUTH EVERY MORNING 90 tablet 1   meloxicam (MOBIC) 15 MG tablet TAKE ONE TABLET BY MOUTH DAILY AS NEEDED FOR PAIN 13 tablet 0   omeprazole (PRILOSEC) 40 MG capsule TAKE ONE CAPSULE BY MOUTH DAILY 60 capsule 1   sildenafil (VIAGRA) 100 MG tablet Take 0.5-1 tablets (50-100 mg total) by mouth daily as needed for erectile dysfunction. 40 tablet 3   vitamin B-12 (CYANOCOBALAMIN) 500 MCG  tablet Take 500 mcg by mouth daily.     No facility-administered medications prior to visit.    No Known Allergies  ROS Review of Systems  Constitutional: Negative.   Neurological:  Positive for numbness. Negative for weakness.     Objective:    Physical Exam Vitals and nursing note reviewed.  HENT:     Head: Normocephalic and atraumatic.  Pulmonary:     Effort: Pulmonary effort is normal.  Musculoskeletal:     Comments: Opposes digits bilaterally with 5/5 strength.  Negative Tinel's and Phalen signs.  Question of thenar atrophy on the left.  Neurological:     Mental Status: He is oriented to person, place, and time.  Psychiatric:        Mood and Affect: Mood normal.        Behavior: Behavior normal.    BP 118/72   Pulse 95   Temp 97.7 F (36.5 C) (Temporal)   Ht 6' (1.829 m)   Wt 233 lb 3.2 oz (105.8 kg)   SpO2 97%   BMI 31.63 kg/m  Wt Readings from Last 3 Encounters:  06/20/21 233 lb 3.2 oz (105.8 kg)  03/08/21 230 lb (104.3 kg)  01/09/21 234 lb (106.1 kg)     Health Maintenance Due  Topic Date Due   Zoster Vaccines- Shingrix (1 of 2) Never done    There are no preventive care reminders to display for this patient.  Lab Results  Component Value Date   TSH 0.972 01/09/2021   Lab Results  Component Value Date   WBC 7.3 10/12/2020   HGB 11.7 (L) 10/12/2020   HCT 34.8 (L) 10/12/2020   MCV 91 10/12/2020   PLT 195 10/12/2020   Lab Results  Component Value Date   NA 142 10/12/2020   K 4.4 10/12/2020   CO2 19 (L) 10/12/2020   GLUCOSE 118 (H) 10/12/2020   BUN 25 10/12/2020   CREATININE 1.31 (H) 10/12/2020   BILITOT 0.4 10/12/2020   ALKPHOS 72 10/12/2020   AST 29 10/12/2020   ALT 28 10/12/2020   PROT 8.1 01/09/2021   ALBUMIN 4.5 10/12/2020   CALCIUM 9.2 10/12/2020   ANIONGAP 16 (H) 06/27/2020   GFR 61.15 01/17/2020   Lab Results  Component Value Date   CHOL 134 01/17/2020   Lab Results  Component Value Date   HDL 65.50 01/17/2020    Lab Results  Component Value Date   LDLCALC 44 01/17/2020  Lab Results  Component Value Date   TRIG 123.0 01/17/2020   Lab Results  Component Value Date   CHOLHDL 2 01/17/2020   Lab Results  Component Value Date   HGBA1C 5.8 (H) 01/09/2021      Assessment & Plan:   Problem List Items Addressed This Visit       Other   B12 deficiency   Relevant Orders   Vitamin B12   Other Visit Diagnoses     Bilateral carpal tunnel syndrome    -  Primary   Relevant Orders   Ambulatory referral to Orthopedic Surgery       No orders of the defined types were placed in this encounter.   Follow-up: Return Return in next few months for follow-up of chronic medical problems fasting.Mliss Sax, MD

## 2021-06-20 NOTE — Progress Notes (Signed)
Established Patient Office Visit  Subjective:  Patient ID: Paul Burke, male    DOB: 19-Nov-1951  Age: 70 y.o. MRN: [id number]  CC: No chief complaint on file.   HPI follow-up of Paul Burke presents for follow-up of bilateral carpal tunnel syndrome and polyneuropathy.  Taking Synthroid as directed.  He has upped his B12 to 5000 MCG's daily.  Taking 4 to 800 mg of gabapentin 3 times daily.  He is interested in injection therapy.  Does not want surgery.  Patient is right-hand dominant.  Past Medical History:  Diagnosis Date   Gout    Hypertension    Thyroid disease     Past Surgical History:  Procedure Laterality Date   NO PAST SURGERIES      Family History  Problem Relation Age of Onset   Neuropathy Neg Hx     Social History   Socioeconomic History   Marital status: Single    Spouse name: Not on file   Number of children: Not on file   Years of education: Not on file   Highest education level: 11th grade  Occupational History   Not on file  Tobacco Use   Smoking status: Former    Types: Cigarettes    Quit date: 23    Years since quitting: 36.5   Smokeless tobacco: Never  Vaping Use   Vaping Use: Never used  Substance and Sexual Activity   Alcohol use: Yes    Alcohol/week: 5.0 - 6.0 standard drinks    Types: 5 - 6 Cans of beer per week    Comment: "weekends I drink 5-6 beers"   Drug use: Never   Sexual activity: Not on file  Other Topics Concern   Not on file  Social History Narrative   Lives at home alone   Right handed   Caffeine: "very seldom"   Social Determinants of Corporate investment banker Strain: Not on file  Food Insecurity: No Food Insecurity   Worried About Programme researcher, broadcasting/film/video in the Last Year: Never true   Ran Out of Food in the Last Year: Never true  Transportation Needs: Not on file  Physical Activity: Inactive   Days of Exercise per Week: 0 days   Minutes of Exercise per Session: 0 min  Stress: No Stress Concern Present    Feeling of Stress : Not at all  Social Connections: Socially Isolated   Frequency of Communication with Friends and Family: More than three times a week   Frequency of Social Gatherings with Friends and Family: More than three times a week   Attends Religious Services: Never   Database administrator or Organizations: No   Attends Engineer, structural: Never   Marital Status: Never married  Catering manager Violence: Not At Risk   Fear of Current or Ex-Partner: No   Emotionally Abused: No   Physically Abused: No   Sexually Abused: No    Outpatient Medications Prior to Visit  Medication Sig Dispense Refill   allopurinol (ZYLOPRIM) 100 MG tablet Take 1 tablet (100 mg total) by mouth 2 (two) times daily. 180 tablet 1   aspirin 81 MG chewable tablet Chew 81 mg by mouth daily.      atorvastatin (LIPITOR) 20 MG tablet TAKE 1 TABLET BY MOUTH EVERY DAY 90 tablet 3   Cholecalciferol 50 MCG (2000 UT) CAPS Take 2,000 Units by mouth daily.     ferrous sulfate 325 (65 FE) MG EC tablet Take  325 mg by mouth daily with breakfast.     gabapentin (NEURONTIN) 400 MG capsule Take 1-2 capsules (400-800 mg total) by mouth 3 (three) times daily. 540 capsule 3   levothyroxine (SYNTHROID) 75 MCG tablet Take 1 tablet (75 mcg total) by mouth daily before breakfast. 90 tablet 3   meloxicam (MOBIC) 15 MG tablet TAKE ONE TABLET BY MOUTH DAILY AS NEEDED FOR PAIN 13 tablet 0   sildenafil (VIAGRA) 100 MG tablet Take 0.5-1 tablets (50-100 mg total) by mouth daily as needed for erectile dysfunction. 40 tablet 3   vitamin B-12 (CYANOCOBALAMIN) 500 MCG tablet Take 500 mcg by mouth daily.     amLODipine (NORVASC) 10 MG tablet TAKE ONE TABLET BY MOUTH EVERY MORNING 90 tablet 1   lisinopril (ZESTRIL) 40 MG tablet TAKE ONE TABLET BY MOUTH EVERY MORNING 90 tablet 1   omeprazole (PRILOSEC) 40 MG capsule Take 1 capsule (40 mg total) by mouth daily. 90 capsule 1   OVER THE COUNTER MEDICATION Slow release iron supplement  (Patient not taking: Reported on 03/08/2021)     No facility-administered medications prior to visit.    No Known Allergies  ROS Review of Systems  Constitutional: Negative.   Respiratory: Negative.    Cardiovascular: Negative.   Gastrointestinal: Negative.   Musculoskeletal: Negative.   Neurological:  Positive for numbness. Negative for weakness.  Psychiatric/Behavioral: Negative.       Objective:    Physical Exam Vitals and nursing note reviewed.  Constitutional:      Appearance: Normal appearance.  Pulmonary:     Effort: Pulmonary effort is normal.  Musculoskeletal:     Right hand: No tenderness or bony tenderness. Normal range of motion. Normal strength.     Left hand: No tenderness or bony tenderness. Normal range of motion. Normal strength.     Comments: Opposes digits.  Negative Phalen and Tinel's sign.  Question of left thenar atrophy.  Neurological:     Mental Status: He is alert.  Psychiatric:        Mood and Affect: Mood normal.        Behavior: Behavior normal.    There were no vitals taken for this visit. Wt Readings from Last 3 Encounters:  06/20/21 233 lb 3.2 oz (105.8 kg)  03/08/21 230 lb (104.3 kg)  01/09/21 234 lb (106.1 kg)     Health Maintenance Due  Topic Date Due   Zoster Vaccines- Shingrix (1 of 2) Never done    There are no preventive care reminders to display for this patient.  Lab Results  Component Value Date   TSH 0.972 01/09/2021   Lab Results  Component Value Date   WBC 7.3 10/12/2020   HGB 11.7 (L) 10/12/2020   HCT 34.8 (L) 10/12/2020   MCV 91 10/12/2020   PLT 195 10/12/2020   Lab Results  Component Value Date   NA 142 10/12/2020   K 4.4 10/12/2020   CO2 19 (L) 10/12/2020   GLUCOSE 118 (H) 10/12/2020   BUN 25 10/12/2020   CREATININE 1.31 (H) 10/12/2020   BILITOT 0.4 10/12/2020   ALKPHOS 72 10/12/2020   AST 29 10/12/2020   ALT 28 10/12/2020   PROT 8.1 01/09/2021   ALBUMIN 4.5 10/12/2020   CALCIUM 9.2 10/12/2020    ANIONGAP 16 (H) 06/27/2020   GFR 61.15 01/17/2020   Lab Results  Component Value Date   CHOL 134 01/17/2020   Lab Results  Component Value Date   HDL 65.50 01/17/2020   Lab  Results  Component Value Date   LDLCALC 44 01/17/2020   Lab Results  Component Value Date   TRIG 123.0 01/17/2020   Lab Results  Component Value Date   CHOLHDL 2 01/17/2020   Lab Results  Component Value Date   HGBA1C 5.8 (H) 01/09/2021      Assessment & Plan:   Problem List Items Addressed This Visit       Other   B12 deficiency   Relevant Orders   Vitamin B12   Paresthesias - Primary   Relevant Orders   Ambulatory referral to Orthopedic Surgery   Other Visit Diagnoses     Bilateral carpal tunnel syndrome       Relevant Orders   Ambulatory referral to Orthopedic Surgery       No orders of the defined types were placed in this encounter.   Follow-up: Return return fasting for other medical problems in the next few months.Mliss Sax, MD

## 2021-06-20 NOTE — Addendum Note (Signed)
Addended by: Nadene Rubins A on: 06/20/2021 12:20 PM   Modules accepted: Orders, Level of Service

## 2021-06-25 ENCOUNTER — Telehealth: Payer: Self-pay | Admitting: Family Medicine

## 2021-06-25 NOTE — Telephone Encounter (Signed)
Pt requesting callback to go over results, please advise

## 2021-06-26 NOTE — Telephone Encounter (Signed)
Patient aware of labs and recommendations   

## 2021-06-29 ENCOUNTER — Other Ambulatory Visit: Payer: Self-pay | Admitting: Family

## 2021-06-29 DIAGNOSIS — M109 Gout, unspecified: Secondary | ICD-10-CM

## 2021-07-03 ENCOUNTER — Other Ambulatory Visit: Payer: Self-pay | Admitting: Family

## 2021-07-03 DIAGNOSIS — M722 Plantar fascial fibromatosis: Secondary | ICD-10-CM

## 2021-07-26 ENCOUNTER — Other Ambulatory Visit: Payer: Self-pay | Admitting: Family Medicine

## 2021-07-26 DIAGNOSIS — N5201 Erectile dysfunction due to arterial insufficiency: Secondary | ICD-10-CM

## 2021-08-02 ENCOUNTER — Other Ambulatory Visit: Payer: Self-pay | Admitting: Family Medicine

## 2021-08-02 DIAGNOSIS — I10 Essential (primary) hypertension: Secondary | ICD-10-CM

## 2021-08-06 DIAGNOSIS — G5603 Carpal tunnel syndrome, bilateral upper limbs: Secondary | ICD-10-CM | POA: Diagnosis not present

## 2021-08-07 ENCOUNTER — Telehealth: Payer: Self-pay

## 2021-08-07 DIAGNOSIS — E782 Mixed hyperlipidemia: Secondary | ICD-10-CM

## 2021-08-07 DIAGNOSIS — I10 Essential (primary) hypertension: Secondary | ICD-10-CM

## 2021-08-07 DIAGNOSIS — M109 Gout, unspecified: Secondary | ICD-10-CM

## 2021-08-07 MED ORDER — ATORVASTATIN CALCIUM 20 MG PO TABS: 20.0000 mg | ORAL_TABLET | Freq: Every day | ORAL | 1 refills | Status: DC

## 2021-08-07 MED ORDER — AMLODIPINE BESYLATE 10 MG PO TABS: 10.0000 mg | ORAL_TABLET | Freq: Every morning | ORAL | 1 refills | Status: DC

## 2021-08-07 MED ORDER — ALLOPURINOL 100 MG PO TABS: 100.0000 mg | ORAL_TABLET | Freq: Two times a day (BID) | ORAL | 1 refills | Status: DC

## 2021-08-07 NOTE — Addendum Note (Signed)
Addended by: Julious Payer A on: 08/07/2021 02:58 PM   Modules accepted: Orders

## 2021-08-07 NOTE — Progress Notes (Signed)
Chronic Care Management Pharmacy Assistant   Name: Paul Burke  MRN: [id number] DOB: 1951/01/25  Reason for Encounter: Medication Review/Medication Coordination Call.   Recent office visits:  06/20/2021 Dr.Kremer MD (PCP) Ambulatory referral to Orthopedic Surgery,No medication Changes noted.  Recent consult visits:  No recent Consult Visit  Hospital visits:  None in previous 6 months  Medications: Outpatient Encounter Medications as of 08/07/2021  Medication Sig   allopurinol (ZYLOPRIM) 100 MG tablet Take 1 tablet (100 mg total) by mouth 2 (two) times daily.   amLODipine (NORVASC) 10 MG tablet TAKE ONE TABLET BY MOUTH EVERY MORNING   aspirin 81 MG chewable tablet Chew 81 mg by mouth daily.    atorvastatin (LIPITOR) 20 MG tablet TAKE 1 TABLET BY MOUTH EVERY DAY   Cholecalciferol 50 MCG (2000 UT) CAPS Take 2,000 Units by mouth daily.   ferrous sulfate 325 (65 FE) MG EC tablet Take 325 mg by mouth daily with breakfast.   gabapentin (NEURONTIN) 400 MG capsule Take 1-2 capsules (400-800 mg total) by mouth 3 (three) times daily.   levothyroxine (SYNTHROID) 75 MCG tablet Take 1 tablet (75 mcg total) by mouth daily before breakfast.   lisinopril (ZESTRIL) 40 MG tablet TAKE ONE TABLET BY MOUTH EVERY MORNING   meloxicam (MOBIC) 15 MG tablet TAKE ONE TABLET BY MOUTH DAILY AS NEEDED FOR PAIN   omeprazole (PRILOSEC) 40 MG capsule TAKE ONE CAPSULE BY MOUTH DAILY   sildenafil (VIAGRA) 100 MG tablet TAKE ONE-HALF TO ONE TABLET BY MOUTH daily AS NEEDED FOR ERECTILE DYSFUNCTION   vitamin B-12 (CYANOCOBALAMIN) 500 MCG tablet Take 500 mcg by mouth daily.   No facility-administered encounter medications on file as of 08/07/2021.    Care Gaps: Shingrix Vaccine  Influenza Vaccine Star Rating Drugs: Atorvastatin 20 mg- Last filled 05/01/2021 for 90 day supply from [facility]. Lisinopril 40 mg- Last filled 05/14/2021 for 90 day supply from [facility]. Medication Fill Gaps: None ID  Reviewed chart  for medication changes ahead of medication coordination call.  BP Readings from Last 3 Encounters:  06/20/21 118/72  01/09/21 138/83  11/15/20 138/78    Lab Results  Component Value Date   HGBA1C 5.8 (H) 01/09/2021     Patient obtains medications through Vials  90 Days   Last adherence delivery included: None ID  Patient declined medications last month Levothyroxine 75 MCG one tablet Daily on 05/14/2021,[facility] Lisinopril 40 mg One tablet Daily on 05/14/2021,receive [facility] supply [facility] Meloxicam 15 mg tablet- 1 tablet daily as needed for pain on 05/14/2021,receive 13 day supply [facility] Amlodipine 10 mg one tablet Daily on 05/30/2021,[facility] supply [facility]. Omeprazole 40 mg One  Capsule Daily on 05/01/2021 receive 30 day supply.(Adequate supply)  Allopurinol 100 mg tablet BID Breakfast, Bedtime- Due to receiving a 90 day supply on 04/10/2021. Sildenafil 100 mg tablet PRN - due to receiving a 20 day supply on 12/13/2020 Atorvastatin 20 mg- 1 tablet daily due to receiving a 90 day supply on 05/01/2021 . Meclizine 25 Tablet- (OTC) Ferrous sulfate 325 MG  tablet Daily Breakfast (OTC) Vitamin B-12 500 Mcg Tablet Daily Breakfast (OTC)  Aspirin 81 mg tablet Daily Breakfast (OTC) Vitamin D 2000 units daily (OTC)  Gabapentin 400 mg capsule Patient states he takes gabapentin 1 capsule four times daily on 05/10/2021,[facility](Adequate supply).  Patient is due for next adherence delivery on: 08/15/2021. Called patient and reviewed medications  and coordinated delivery.  This delivery to include: Levothyroxine 75 MCG one tablet Daily  Lisinopril 40 mg One tablet Daily  Meloxicam 15 mg tablet- 1 tablet daily as needed for pain Amlodipine 10 mg one tablet Daily Omeprazole 40 mg One  Capsule Daily Allopurinol 100 mg tablet BID Breakfast, Bedtime Atorvastatin 20 mg- 1  tablet daily  Gabapentin 400 mg capsule Patient states he takes gabapentin 1 capsule four times daily   Patient declined the following medications Sildenafil 100 mg tablet PRN - due to receiving a 10 day supply on 07/26/2021 Meclizine 25 Tablet- (OTC) Ferrous sulfate 325 MG  tablet Daily Breakfast (OTC) Vitamin B-12 500 Mcg Tablet Daily Breakfast (OTC)  Aspirin 81 mg tablet Daily Breakfast (OTC) Vitamin D 2000 units daily (OTC)   Patient needs refills for Amlodipine 10 mg ,Meloxicam 15 mg,Allopurinol 100 mg .  Patient is asking if the clinical pharmacist could increase his Meloxicam 15 mg to 30 mg.Notified Clinical Pharmacist of request.   Confirmed delivery date of 08/09/2021, advised patient that pharmacy will contact them the morning of delivery.  Patient had a follow up with the PCP on 06/20/2021.  Everlean Cherry Clinical Pharmacist Assistant [phone]

## 2021-10-27 ENCOUNTER — Other Ambulatory Visit: Payer: Self-pay | Admitting: Family Medicine

## 2021-10-27 ENCOUNTER — Other Ambulatory Visit: Payer: Self-pay | Admitting: Family

## 2021-10-27 DIAGNOSIS — E079 Disorder of thyroid, unspecified: Secondary | ICD-10-CM

## 2021-10-27 DIAGNOSIS — K219 Gastro-esophageal reflux disease without esophagitis: Secondary | ICD-10-CM

## 2021-10-29 DIAGNOSIS — H25043 Posterior subcapsular polar age-related cataract, bilateral: Secondary | ICD-10-CM | POA: Diagnosis not present

## 2021-10-29 DIAGNOSIS — H5203 Hypermetropia, bilateral: Secondary | ICD-10-CM | POA: Diagnosis not present

## 2021-10-29 DIAGNOSIS — H2513 Age-related nuclear cataract, bilateral: Secondary | ICD-10-CM | POA: Diagnosis not present

## 2021-10-29 DIAGNOSIS — R7303 Prediabetes: Secondary | ICD-10-CM | POA: Diagnosis not present

## 2021-10-29 DIAGNOSIS — H524 Presbyopia: Secondary | ICD-10-CM | POA: Diagnosis not present

## 2021-10-29 DIAGNOSIS — H43393 Other vitreous opacities, bilateral: Secondary | ICD-10-CM | POA: Diagnosis not present

## 2021-10-29 DIAGNOSIS — H52223 Regular astigmatism, bilateral: Secondary | ICD-10-CM | POA: Diagnosis not present

## 2021-11-01 ENCOUNTER — Telehealth: Payer: Self-pay

## 2021-11-01 NOTE — Progress Notes (Signed)
Chronic Care Management Pharmacy Assistant   Name: Paul Burke  MRN: [id number] DOB: October 24, 1951  Reason for Encounter: Medication Review/Medication Coordination Call.   Recent office visits:  No recent office visit  Recent consult visits:  No recent consult Visit  Hospital visits:  None in previous 6 months  Medications: Outpatient Encounter Medications as of 11/01/2021  Medication Sig   allopurinol (ZYLOPRIM) 100 MG tablet Take 1 tablet (100 mg total) by mouth 2 (two) times daily.   amLODipine (NORVASC) 10 MG tablet Take 1 tablet (10 mg total) by mouth every morning.   aspirin 81 MG chewable tablet Chew 81 mg by mouth daily.    atorvastatin (LIPITOR) 20 MG tablet Take 1 tablet (20 mg total) by mouth daily.   Cholecalciferol 50 MCG (2000 UT) CAPS Take 2,000 Units by mouth daily.   ferrous sulfate 325 (65 FE) MG EC tablet Take 325 mg by mouth daily with breakfast.   gabapentin (NEURONTIN) 400 MG capsule Take 1-2 capsules (400-800 mg total) by mouth 3 (three) times daily.   levothyroxine (SYNTHROID) 75 MCG tablet Take 1 tablet (75 mcg total) by mouth daily before breakfast.   lisinopril (ZESTRIL) 40 MG tablet TAKE ONE TABLET BY MOUTH EVERY MORNING   meloxicam (MOBIC) 15 MG tablet TAKE ONE TABLET BY MOUTH DAILY AS NEEDED FOR PAIN   omeprazole (PRILOSEC) 40 MG capsule TAKE ONE TABLET BY MOUTH ONCE DAILY   sildenafil (VIAGRA) 100 MG tablet TAKE ONE-HALF TO ONE TABLET BY MOUTH daily AS NEEDED FOR ERECTILE DYSFUNCTION   vitamin B-12 (CYANOCOBALAMIN) 500 MCG tablet Take 500 mcg by mouth daily.   No facility-administered encounter medications on file as of 11/01/2021.    Care Gaps: Shingrix Vaccine  Influenza Vaccine Star Rating Drugs: Atorvastatin 20 mg- Last filled 08/08/2021 for 90 day supply from [facility]. Lisinopril 40 mg- Last filled 08/08/2021 for 90 day supply from [facility]. Medication Fill Gaps: None ID  Reviewed chart for medication changes ahead of medication  coordination call.   BP Readings from Last 3 Encounters:  06/20/21 118/72  01/09/21 138/83  11/15/20 138/78    Lab Results  Component Value Date   HGBA1C 5.8 (H) 01/09/2021     Patient obtains medications through Vials  90 Days   Last adherence delivery included:  Levothyroxine 75 MCG one tablet Daily  Lisinopril 40 mg One tablet Daily  Meloxicam 15 mg tablet- 1 tablet daily as needed for pain Amlodipine 10 mg one tablet Daily Omeprazole 40 mg One  Capsule Daily Allopurinol 100 mg tablet BID Breakfast, Bedtime Atorvastatin 20 mg- 1 tablet daily  Gabapentin 400 mg capsule Patient states he takes gabapentin 1 capsule four times daily   Patient declined medications last month  Sildenafil 100 mg tablet PRN - due to receiving a 10 day supply on 07/26/2021 Meclizine 25 Tablet- (OTC) Ferrous sulfate 325 MG  tablet Daily Breakfast (OTC) Vitamin B-12 500 Mcg Tablet Daily Breakfast (OTC)  Aspirin 81 mg tablet Daily Breakfast (OTC) Vitamin D 2000 units daily (OTC)   Patient is due for next adherence delivery on: 11/13/2021. Called patient and reviewed medications and coordinated delivery.  This delivery to include: Levothyroxine 75 MCG one tablet Daily  Lisinopril 40 mg One tablet Daily  Meloxicam 15 mg tablet- 1 tablet daily as needed for pain Amlodipine 10 mg one tablet Daily Omeprazole 40 mg One  Capsule Daily Allopurinol 100 mg tablet BID Breakfast, Bedtime Atorvastatin 20 mg- 1 tablet daily  Gabapentin 400 mg capsule Patient  states he takes gabapentin 1 capsule four times daily   Patient declined the following medications: Sildenafil 100 mg tablet PRN - due to receiving a 10 day supply on 07/26/2021 Meclizine 25 Tablet- (OTC) Ferrous sulfate 325 MG  tablet Daily Breakfast (OTC) Vitamin B-12 500 Mcg Tablet Daily Breakfast (OTC)  Aspirin 81 mg tablet Daily Breakfast (OTC) Vitamin D 2000 units daily (OTC)   Patient needs refills for Levothyroxine, Meloxicam I reach out to  patient PCP to request a refill on 11/04/2021 to go [facility]..  Confirmed delivery date of 11/13/2021, advised patient that pharmacy will contact them the morning of delivery.  Everlean Cherry Clinical Pharmacist Assistant [phone]

## 2021-11-04 ENCOUNTER — Other Ambulatory Visit: Payer: Self-pay | Admitting: Family Medicine

## 2021-11-04 DIAGNOSIS — M722 Plantar fascial fibromatosis: Secondary | ICD-10-CM

## 2021-11-04 DIAGNOSIS — E079 Disorder of thyroid, unspecified: Secondary | ICD-10-CM

## 2021-11-04 MED ORDER — LEVOTHYROXINE SODIUM 75 MCG PO TABS: 75.0000 ug | ORAL_TABLET | Freq: Every day | ORAL | 3 refills | Status: DC

## 2021-11-04 MED ORDER — MELOXICAM 15 MG PO TABS: ORAL_TABLET | ORAL | 0 refills | Status: DC

## 2021-11-04 NOTE — Telephone Encounter (Signed)
Refill request for pending Rx last OV 06/20/21 last refill Meloxicam 12/18/20 last refill Levothyroxine 11/11/20. Upcoming appointment scheduled for follow up on meds. Please advise.

## 2021-11-05 NOTE — Telephone Encounter (Signed)
Rx sent in

## 2021-11-07 ENCOUNTER — Other Ambulatory Visit: Payer: Self-pay | Admitting: Family

## 2021-11-07 DIAGNOSIS — M722 Plantar fascial fibromatosis: Secondary | ICD-10-CM

## 2021-11-08 ENCOUNTER — Telehealth: Payer: Self-pay

## 2021-11-08 MED ORDER — MELOXICAM 15 MG PO TABS: ORAL_TABLET | ORAL | 0 refills | Status: DC

## 2021-11-08 MED ORDER — MELOXICAM 15 MG PO TABS: ORAL_TABLET | ORAL | 0 refills | Status: AC

## 2021-11-08 NOTE — Addendum Note (Signed)
Addended by: Julious Payer A on: 11/08/2021 09:29 AM   Modules accepted: Orders

## 2021-11-08 NOTE — Progress Notes (Signed)
Patient states he had appointment with his Ophthalmology yesterday on 11/07/2021, and they prescribe him three new eyes drop ( ketorolac, moxifloxacin, prednisoLONE acetate) that he him to go ahead and start taking since he has a procedure coming up on 11/20/2021.Patient states he was given a hand prescription, and he was unaware [facility] was located in [place]. Patient is asking if we are able to fill it with the order ID number.Notified Clinical Pharmacist.  Per Clinical Pharmacist,I would call the specialist office and see if they can resend it electronically to [facility].If not he can always take that Rx to a local pharmacy like [facility] and have them fill it.  I tried to reach out to patient Ophthalmology without success since they are close on Fridays.Notified Clinical Pharmacist.   Per Clinical Pharmacist, we can always call the specialist again on Monday if the patient prefers to wait until Monday.   Patient verbalized understanding, and states he would get [facility].Patient states he was unaware he could do that since he received all his medications [facility].   Everlean Cherry Clinical Pharmacist Assistant [phone]

## 2021-11-08 NOTE — Addendum Note (Signed)
Addended by: Julious Payer A on: 11/08/2021 02:27 PM   Modules accepted: Orders

## 2021-11-11 ENCOUNTER — Other Ambulatory Visit: Payer: Self-pay | Admitting: Family

## 2021-11-11 DIAGNOSIS — M722 Plantar fascial fibromatosis: Secondary | ICD-10-CM

## 2021-11-19 ENCOUNTER — Ambulatory Visit: Payer: Medicare Other

## 2021-11-20 DIAGNOSIS — H25811 Combined forms of age-related cataract, right eye: Secondary | ICD-10-CM | POA: Diagnosis not present

## 2021-11-20 DIAGNOSIS — H2511 Age-related nuclear cataract, right eye: Secondary | ICD-10-CM | POA: Diagnosis not present

## 2021-11-20 DIAGNOSIS — H2513 Age-related nuclear cataract, bilateral: Secondary | ICD-10-CM | POA: Diagnosis not present

## 2021-12-13 ENCOUNTER — Ambulatory Visit: Payer: Medicare Other | Admitting: Family Medicine

## 2022-01-02 DIAGNOSIS — H2512 Age-related nuclear cataract, left eye: Secondary | ICD-10-CM | POA: Diagnosis not present

## 2022-01-02 DIAGNOSIS — H25042 Posterior subcapsular polar age-related cataract, left eye: Secondary | ICD-10-CM | POA: Diagnosis not present

## 2022-01-28 DIAGNOSIS — D539 Nutritional anemia, unspecified: Secondary | ICD-10-CM | POA: Diagnosis not present

## 2022-01-28 DIAGNOSIS — E7800 Pure hypercholesterolemia, unspecified: Secondary | ICD-10-CM | POA: Diagnosis not present

## 2022-01-28 DIAGNOSIS — R300 Dysuria: Secondary | ICD-10-CM | POA: Diagnosis not present

## 2022-01-28 DIAGNOSIS — E559 Vitamin D deficiency, unspecified: Secondary | ICD-10-CM | POA: Diagnosis not present

## 2022-01-28 DIAGNOSIS — Z0000 Encounter for general adult medical examination without abnormal findings: Secondary | ICD-10-CM | POA: Diagnosis not present

## 2022-01-28 DIAGNOSIS — Z20822 Contact with and (suspected) exposure to covid-19: Secondary | ICD-10-CM | POA: Diagnosis not present

## 2022-01-28 DIAGNOSIS — R5383 Other fatigue: Secondary | ICD-10-CM | POA: Diagnosis not present

## 2022-01-28 DIAGNOSIS — M25551 Pain in right hip: Secondary | ICD-10-CM | POA: Diagnosis not present

## 2022-01-28 DIAGNOSIS — I10 Essential (primary) hypertension: Secondary | ICD-10-CM | POA: Diagnosis not present

## 2022-01-28 DIAGNOSIS — Z79899 Other long term (current) drug therapy: Secondary | ICD-10-CM | POA: Diagnosis not present

## 2022-01-28 DIAGNOSIS — M129 Arthropathy, unspecified: Secondary | ICD-10-CM | POA: Diagnosis not present

## 2022-01-28 DIAGNOSIS — M8589 Other specified disorders of bone density and structure, multiple sites: Secondary | ICD-10-CM | POA: Diagnosis not present

## 2022-01-29 DIAGNOSIS — H2512 Age-related nuclear cataract, left eye: Secondary | ICD-10-CM | POA: Diagnosis not present

## 2022-01-29 DIAGNOSIS — H25812 Combined forms of age-related cataract, left eye: Secondary | ICD-10-CM | POA: Diagnosis not present

## 2022-01-30 ENCOUNTER — Other Ambulatory Visit: Payer: Self-pay | Admitting: Neurology

## 2022-01-30 DIAGNOSIS — R202 Paresthesia of skin: Secondary | ICD-10-CM

## 2022-01-31 ENCOUNTER — Telehealth: Payer: Self-pay

## 2022-01-31 DIAGNOSIS — E782 Mixed hyperlipidemia: Secondary | ICD-10-CM

## 2022-01-31 DIAGNOSIS — I10 Essential (primary) hypertension: Secondary | ICD-10-CM

## 2022-01-31 DIAGNOSIS — M109 Gout, unspecified: Secondary | ICD-10-CM

## 2022-01-31 NOTE — Progress Notes (Signed)
? ? ?Chronic Care Management ?Pharmacy Assistant  ? ?Name: Paul Burke  MRN: [id number] DOB: 27-Apr-1951 ? ?Reason for Encounter: Medication Review/Medication Coordination Call. ?  ?Recent office visits:  ?No recent office visit ? ?Recent consult visits:  ?01/30/2022 Dr. Sondra Barges MD (Ophthalmology) Change Ketorolac and Moxifloxacin: 1 drop 4 times a day to the operative eye ,  Prednisolone acetate 1%: 1 drop 4 times a day to operative eye, Follow up: 1 to 2 weeks ?01/29/2022 Dr. Sondra Barges MD (Ophthalmology) Unable to see note ?01/02/2022 Dr. Sondra Barges MD (Ophthalmology) Unable to see note ?12/06/2021 Dr. Sondra Barges MD (Ophthalmology) Decrease Prednisolone and Ketorolac to 2 times daily. 12 hours apart, Discontinue Moxifloxacin, Discontinue using the patch at bedtime  ?11/21/2021 Dr. Sondra Barges MD (Ophthalmology) Unable to see note ?11/20/2021 Dr. Sondra Barges MD (Ophthalmology) Unable to see note ?11/07/2021 Dr. Sondra Barges MD (Ophthalmology)Start Topical Antibiotic, Moxifloxacin 0.3% QID OD, Start Topical NSAIDs, Ketorolac 0.5% QID OD ? ?Hospital visits:  ?None in previous 6 months ? ?Medications: ?Outpatient Encounter Medications as of 01/31/2022  ?Medication Sig  ? allopurinol (ZYLOPRIM) 100 MG tablet Take 1 tablet (100 mg total) by mouth 2 (two) times daily.  ? amLODipine (NORVASC) 10 MG tablet Take 1 tablet (10 mg total) by mouth every morning.  ? aspirin 81 MG chewable tablet Chew 81 mg by mouth daily.   ? atorvastatin (LIPITOR) 20 MG tablet Take 1 tablet (20 mg total) by mouth daily.  ? Cholecalciferol 50 MCG (2000 UT) CAPS Take 2,000 Units by mouth daily.  ? ferrous sulfate 325 (65 FE) MG EC tablet Take 325 mg by mouth daily with breakfast.  ? gabapentin (NEURONTIN) 400 MG capsule Take 1-2 capsules (400-800 mg total) by mouth 3 (three) times daily.  ? levothyroxine (SYNTHROID) 75 MCG tablet Take 1 tablet (75 mcg total) by mouth daily before breakfast.  ? lisinopril (ZESTRIL) 40 MG tablet TAKE ONE TABLET BY MOUTH EVERY MORNING  ?  meloxicam (MOBIC) 15 MG tablet TAKE ONE TABLET BY MOUTH DAILY AS NEEDED FOR PAIN ?Strength: 15 mg  ? omeprazole (PRILOSEC) 40 MG capsule TAKE ONE TABLET BY MOUTH ONCE DAILY  ? sildenafil (VIAGRA) 100 MG tablet TAKE ONE-HALF TO ONE TABLET BY MOUTH daily AS NEEDED FOR ERECTILE DYSFUNCTION  ? vitamin B-12 (CYANOCOBALAMIN) 500 MCG tablet Take 500 mcg by mouth daily.  ? ?No facility-administered encounter medications on file as of 01/31/2022.  ? ? ?Care Gaps: ?Shingrix Vaccine  ?Influenza Vaccine ? ?Star Rating Drugs: ?Atorvastatin 20 mg- Last filled 11/07/2021 for 90 day supply from [facility]. ?Lisinopril 40 mg- Last filled 11/07/2021 for 90 day supply from [facility]. ? ?Medication Fill Gaps: ?None ID ? ?Reviewed chart for medication changes ahead of medication coordination call. ? ?BP Readings from Last 3 Encounters:  ?06/20/21 118/72  ?01/09/21 138/83  ?11/15/20 138/78  ?  ?Lab Results  ?Component Value Date  ? HGBA1C 5.8 (H) 01/09/2021  ?  ? ?Patient obtains medications through Vials  90 Days  ? ?Last adherence delivery included: ?Levothyroxine 75 MCG one tablet Daily  ?Lisinopril 40 mg One tablet Daily  ?Meloxicam 15 mg tablet- 1 tablet daily as needed for pain ?Amlodipine 10 mg one tablet Daily ?Omeprazole 40 mg One  Capsule Daily ?Allopurinol 100 mg tablet BID Breakfast, Bedtime ?Atorvastatin 20 mg- 1 tablet daily  ?Gabapentin 400 mg capsule Patient states he takes gabapentin 1 capsule four times daily  ? ?Patient declined medications  last month  ?Sildenafil 100 mg tablet PRN - due to receiving a 10 day supply  on 07/26/2021 ?Meclizine 25 Tablet- (OTC) ?Ferrous sulfate 325 MG  tablet Daily Breakfast (OTC) ?Vitamin B-12 500 Mcg Tablet Daily Breakfast (OTC) ? Aspirin 81 mg tablet Daily Breakfast (OTC) ?Vitamin D 2000 units daily (OTC)  ? ?Patient is due for next adherence delivery on: 02/12/2022. ?Called patient and reviewed medications and coordinated delivery. ? ?This delivery to include: ?Levothyroxine 75 MCG one  tablet Daily  ?Lisinopril 40 mg One tablet Daily  ?Meloxicam 15 mg tablet- 1 tablet daily  ?Amlodipine 10 mg one tablet Daily ?Omeprazole 40 mg One  Capsule Daily ?Allopurinol 100 mg tablet BID Breakfast, Bedtime ?Atorvastatin 20 mg- 1 tablet daily  ?Gabapentin 400 mg capsule Take 1-2 capsules (400-800 mg total) by mouth 3 (three) times daily ? ? ?Patient declined the following medications ?Sildenafil 100 mg tablet PRN - due to receiving a 10 day supply on 07/26/2021 ?Meclizine 25 Tablet- (OTC) ?Ferrous sulfate 325 MG  tablet Daily Breakfast (OTC) ?Vitamin B-12 500 Mcg Tablet Daily Breakfast (OTC) ? Aspirin 81 mg tablet Daily Breakfast (OTC) ?Vitamin D 2000 units daily (OTC)  ? ?Patient needs refills for Lisinopril,Atorvastatin , Amlodipine, Allopurinol prescribe by PCP . ? ?Confirmed delivery date of 02/12/2022 ([facility]), advised patient that pharmacy will contact them the morning of delivery. ? ?Everlean Cherry ?Clinical Pharmacist Assistant ?[phone]  ? ?

## 2022-02-04 DIAGNOSIS — N183 Chronic kidney disease, stage 3 unspecified: Secondary | ICD-10-CM | POA: Diagnosis not present

## 2022-02-04 DIAGNOSIS — I10 Essential (primary) hypertension: Secondary | ICD-10-CM | POA: Diagnosis not present

## 2022-02-04 DIAGNOSIS — R739 Hyperglycemia, unspecified: Secondary | ICD-10-CM | POA: Diagnosis not present

## 2022-02-05 MED ORDER — ATORVASTATIN CALCIUM 20 MG PO TABS: 20.0000 mg | ORAL_TABLET | Freq: Every day | ORAL | 1 refills | Status: DC

## 2022-02-05 MED ORDER — LISINOPRIL 40 MG PO TABS: 40.0000 mg | ORAL_TABLET | Freq: Every morning | ORAL | 1 refills | Status: DC

## 2022-02-05 MED ORDER — AMLODIPINE BESYLATE 10 MG PO TABS: 10.0000 mg | ORAL_TABLET | Freq: Every morning | ORAL | 1 refills | Status: DC

## 2022-02-05 MED ORDER — ALLOPURINOL 100 MG PO TABS: 100.0000 mg | ORAL_TABLET | Freq: Two times a day (BID) | ORAL | 1 refills | Status: DC

## 2022-02-05 NOTE — Addendum Note (Signed)
Addended by: Daron Offer A on: 02/05/2022 12:31 PM ? ? Modules accepted: Orders ? ?

## 2022-02-06 ENCOUNTER — Other Ambulatory Visit: Payer: Self-pay | Admitting: Neurology

## 2022-02-06 DIAGNOSIS — R202 Paresthesia of skin: Secondary | ICD-10-CM

## 2022-02-20 DIAGNOSIS — N183 Chronic kidney disease, stage 3 unspecified: Secondary | ICD-10-CM | POA: Diagnosis not present

## 2022-03-04 DIAGNOSIS — H524 Presbyopia: Secondary | ICD-10-CM | POA: Diagnosis not present

## 2022-03-04 DIAGNOSIS — H5201 Hypermetropia, right eye: Secondary | ICD-10-CM | POA: Diagnosis not present

## 2022-03-04 DIAGNOSIS — H52203 Unspecified astigmatism, bilateral: Secondary | ICD-10-CM | POA: Diagnosis not present

## 2022-04-27 ENCOUNTER — Other Ambulatory Visit: Payer: Self-pay | Admitting: Family Medicine

## 2022-04-27 DIAGNOSIS — K219 Gastro-esophageal reflux disease without esophagitis: Secondary | ICD-10-CM

## 2022-07-08 DIAGNOSIS — N1831 Chronic kidney disease, stage 3a: Secondary | ICD-10-CM | POA: Diagnosis not present

## 2022-07-08 DIAGNOSIS — I10 Essential (primary) hypertension: Secondary | ICD-10-CM | POA: Diagnosis not present

## 2022-07-08 DIAGNOSIS — E039 Hypothyroidism, unspecified: Secondary | ICD-10-CM | POA: Diagnosis not present

## 2022-07-08 DIAGNOSIS — E559 Vitamin D deficiency, unspecified: Secondary | ICD-10-CM | POA: Diagnosis not present

## 2022-07-08 DIAGNOSIS — E7800 Pure hypercholesterolemia, unspecified: Secondary | ICD-10-CM | POA: Diagnosis not present

## 2022-07-08 DIAGNOSIS — R5383 Other fatigue: Secondary | ICD-10-CM | POA: Diagnosis not present

## 2022-07-08 DIAGNOSIS — Z79899 Other long term (current) drug therapy: Secondary | ICD-10-CM | POA: Diagnosis not present

## 2022-07-08 DIAGNOSIS — R000 Tachycardia, unspecified: Secondary | ICD-10-CM | POA: Diagnosis not present

## 2022-07-27 ENCOUNTER — Other Ambulatory Visit: Payer: Self-pay | Admitting: Family Medicine

## 2022-07-27 DIAGNOSIS — M109 Gout, unspecified: Secondary | ICD-10-CM

## 2022-07-27 DIAGNOSIS — I10 Essential (primary) hypertension: Secondary | ICD-10-CM

## 2022-07-27 DIAGNOSIS — E782 Mixed hyperlipidemia: Secondary | ICD-10-CM

## 2022-07-30 ENCOUNTER — Other Ambulatory Visit: Payer: Self-pay | Admitting: Neurology

## 2022-07-30 DIAGNOSIS — R202 Paresthesia of skin: Secondary | ICD-10-CM

## 2022-10-27 ENCOUNTER — Other Ambulatory Visit: Payer: Self-pay | Admitting: Family Medicine

## 2022-10-27 DIAGNOSIS — K219 Gastro-esophageal reflux disease without esophagitis: Secondary | ICD-10-CM

## 2022-10-27 DIAGNOSIS — E079 Disorder of thyroid, unspecified: Secondary | ICD-10-CM

## 2022-11-03 ENCOUNTER — Other Ambulatory Visit: Payer: Self-pay | Admitting: Family Medicine

## 2022-11-03 DIAGNOSIS — M109 Gout, unspecified: Secondary | ICD-10-CM

## 2022-11-03 DIAGNOSIS — E782 Mixed hyperlipidemia: Secondary | ICD-10-CM

## 2022-11-03 DIAGNOSIS — I10 Essential (primary) hypertension: Secondary | ICD-10-CM

## 2022-11-05 ENCOUNTER — Other Ambulatory Visit: Payer: Self-pay | Admitting: Family Medicine

## 2022-11-05 DIAGNOSIS — E079 Disorder of thyroid, unspecified: Secondary | ICD-10-CM

## 2022-11-05 DIAGNOSIS — M109 Gout, unspecified: Secondary | ICD-10-CM

## 2022-11-05 DIAGNOSIS — K219 Gastro-esophageal reflux disease without esophagitis: Secondary | ICD-10-CM

## 2022-11-10 ENCOUNTER — Other Ambulatory Visit: Payer: Self-pay | Admitting: Family Medicine

## 2022-11-10 DIAGNOSIS — E079 Disorder of thyroid, unspecified: Secondary | ICD-10-CM

## 2022-11-10 DIAGNOSIS — K219 Gastro-esophageal reflux disease without esophagitis: Secondary | ICD-10-CM

## 2022-11-10 DIAGNOSIS — M109 Gout, unspecified: Secondary | ICD-10-CM

## 2023-01-01 ENCOUNTER — Other Ambulatory Visit: Payer: Self-pay | Admitting: Family Medicine

## 2023-01-01 DIAGNOSIS — K219 Gastro-esophageal reflux disease without esophagitis: Secondary | ICD-10-CM

## 2023-01-01 DIAGNOSIS — M109 Gout, unspecified: Secondary | ICD-10-CM

## 2023-01-01 DIAGNOSIS — E079 Disorder of thyroid, unspecified: Secondary | ICD-10-CM

## 2023-01-24 ENCOUNTER — Other Ambulatory Visit: Payer: Self-pay | Admitting: Family Medicine

## 2023-01-24 DIAGNOSIS — I10 Essential (primary) hypertension: Secondary | ICD-10-CM

## 2023-01-30 ENCOUNTER — Other Ambulatory Visit: Payer: Self-pay | Admitting: Family Medicine

## 2023-01-30 DIAGNOSIS — M109 Gout, unspecified: Secondary | ICD-10-CM

## 2023-01-30 DIAGNOSIS — K219 Gastro-esophageal reflux disease without esophagitis: Secondary | ICD-10-CM

## 2023-01-30 DIAGNOSIS — E079 Disorder of thyroid, unspecified: Secondary | ICD-10-CM

## 2023-03-13 ENCOUNTER — Other Ambulatory Visit: Payer: Self-pay | Admitting: Family Medicine

## 2023-03-13 DIAGNOSIS — E079 Disorder of thyroid, unspecified: Secondary | ICD-10-CM

## 2023-04-25 ENCOUNTER — Other Ambulatory Visit: Payer: Self-pay | Admitting: Neurology

## 2023-04-25 DIAGNOSIS — R202 Paresthesia of skin: Secondary | ICD-10-CM

## 2023-05-01 ENCOUNTER — Other Ambulatory Visit: Payer: Self-pay | Admitting: Family Medicine

## 2023-05-01 DIAGNOSIS — K219 Gastro-esophageal reflux disease without esophagitis: Secondary | ICD-10-CM

## 2023-05-04 ENCOUNTER — Other Ambulatory Visit: Payer: Self-pay | Admitting: Family Medicine

## 2023-05-04 DIAGNOSIS — M109 Gout, unspecified: Secondary | ICD-10-CM
